# Patient Record
Sex: Female | Born: 1985
Health system: Southern US, Community
[De-identification: ages and names within clinical notes are randomized; demographics above are authoritative.]

## PROBLEM LIST (undated history)

## (undated) DIAGNOSIS — M797 Fibromyalgia: Secondary | ICD-10-CM

## (undated) DIAGNOSIS — F419 Anxiety disorder, unspecified: Secondary | ICD-10-CM

## (undated) DIAGNOSIS — T7840XA Allergy, unspecified, initial encounter: Secondary | ICD-10-CM

## (undated) DIAGNOSIS — M5430 Sciatica, unspecified side: Secondary | ICD-10-CM

## (undated) DIAGNOSIS — R519 Headache, unspecified: Secondary | ICD-10-CM

## (undated) DIAGNOSIS — N200 Calculus of kidney: Secondary | ICD-10-CM

## (undated) DIAGNOSIS — I639 Cerebral infarction, unspecified: Secondary | ICD-10-CM

## (undated) DIAGNOSIS — I10 Essential (primary) hypertension: Secondary | ICD-10-CM

## (undated) DIAGNOSIS — G9050 Complex regional pain syndrome I, unspecified: Secondary | ICD-10-CM

## (undated) DIAGNOSIS — J45909 Unspecified asthma, uncomplicated: Secondary | ICD-10-CM

## (undated) DIAGNOSIS — F32A Depression, unspecified: Secondary | ICD-10-CM

## (undated) HISTORY — DX: Headache, unspecified: R51.9

## (undated) HISTORY — DX: Anxiety disorder, unspecified: F41.9

## (undated) HISTORY — DX: Calculus of kidney: N20.0

## (undated) HISTORY — DX: Allergy, unspecified, initial encounter: T78.40XA

---

## 2016-07-15 DIAGNOSIS — G43709 Chronic migraine without aura, not intractable, without status migrainosus: Secondary | ICD-10-CM | POA: Insufficient documentation

## 2016-12-14 DIAGNOSIS — R197 Diarrhea, unspecified: Secondary | ICD-10-CM | POA: Insufficient documentation

## 2017-05-12 DIAGNOSIS — M797 Fibromyalgia: Secondary | ICD-10-CM | POA: Insufficient documentation

## 2017-07-25 DIAGNOSIS — E669 Obesity, unspecified: Secondary | ICD-10-CM | POA: Insufficient documentation

## 2017-07-25 DIAGNOSIS — E66812 Obesity, class 2: Secondary | ICD-10-CM | POA: Insufficient documentation

## 2017-08-08 DIAGNOSIS — G4700 Insomnia, unspecified: Secondary | ICD-10-CM | POA: Insufficient documentation

## 2017-08-10 DIAGNOSIS — IMO0002 Reserved for concepts with insufficient information to code with codable children: Secondary | ICD-10-CM | POA: Insufficient documentation

## 2017-10-05 DIAGNOSIS — G894 Chronic pain syndrome: Secondary | ICD-10-CM | POA: Insufficient documentation

## 2018-05-09 HISTORY — PX: DENTAL SURGERY: SHX609

## 2018-07-13 DIAGNOSIS — M5430 Sciatica, unspecified side: Secondary | ICD-10-CM | POA: Insufficient documentation

## 2018-07-13 DIAGNOSIS — F930 Separation anxiety disorder of childhood: Secondary | ICD-10-CM | POA: Insufficient documentation

## 2018-07-13 DIAGNOSIS — G9050 Complex regional pain syndrome I, unspecified: Secondary | ICD-10-CM | POA: Insufficient documentation

## 2018-07-13 DIAGNOSIS — F4312 Post-traumatic stress disorder, chronic: Secondary | ICD-10-CM | POA: Insufficient documentation

## 2018-07-13 DIAGNOSIS — F32A Depression, unspecified: Secondary | ICD-10-CM | POA: Insufficient documentation

## 2018-07-13 DIAGNOSIS — F4310 Post-traumatic stress disorder, unspecified: Secondary | ICD-10-CM | POA: Insufficient documentation

## 2019-02-27 DIAGNOSIS — T25222A Burn of second degree of left foot, initial encounter: Secondary | ICD-10-CM | POA: Insufficient documentation

## 2019-05-16 DIAGNOSIS — M79642 Pain in left hand: Secondary | ICD-10-CM | POA: Insufficient documentation

## 2019-05-16 DIAGNOSIS — M79641 Pain in right hand: Secondary | ICD-10-CM | POA: Insufficient documentation

## 2019-12-06 DIAGNOSIS — R87619 Unspecified abnormal cytological findings in specimens from cervix uteri: Secondary | ICD-10-CM | POA: Insufficient documentation

## 2019-12-06 DIAGNOSIS — Z5948 Other specified lack of adequate food: Secondary | ICD-10-CM | POA: Insufficient documentation

## 2019-12-06 DIAGNOSIS — Z599 Problem related to housing and economic circumstances, unspecified: Secondary | ICD-10-CM | POA: Insufficient documentation

## 2019-12-06 DIAGNOSIS — F17200 Nicotine dependence, unspecified, uncomplicated: Secondary | ICD-10-CM | POA: Insufficient documentation

## 2019-12-06 DIAGNOSIS — R030 Elevated blood-pressure reading, without diagnosis of hypertension: Secondary | ICD-10-CM | POA: Insufficient documentation

## 2020-08-01 ENCOUNTER — Emergency Department: Payer: Medicare HMO

## 2020-08-01 ENCOUNTER — Other Ambulatory Visit: Payer: Self-pay

## 2020-08-01 ENCOUNTER — Emergency Department
Admission: EM | Admit: 2020-08-01 | Discharge: 2020-08-01 | Disposition: A | Discharge status: Home / Self Care | Payer: Medicare HMO | Attending: Emergency Medicine | Admitting: Emergency Medicine

## 2020-08-01 DIAGNOSIS — F17200 Nicotine dependence, unspecified, uncomplicated: Secondary | ICD-10-CM | POA: Diagnosis not present

## 2020-08-01 DIAGNOSIS — M545 Low back pain, unspecified: Secondary | ICD-10-CM

## 2020-08-01 DIAGNOSIS — G8929 Other chronic pain: Secondary | ICD-10-CM | POA: Diagnosis not present

## 2020-08-01 DIAGNOSIS — R930 Abnormal findings on diagnostic imaging of skull and head, not elsewhere classified: Secondary | ICD-10-CM | POA: Insufficient documentation

## 2020-08-01 DIAGNOSIS — M549 Dorsalgia, unspecified: Secondary | ICD-10-CM | POA: Diagnosis present

## 2020-08-01 DIAGNOSIS — R519 Headache, unspecified: Secondary | ICD-10-CM | POA: Insufficient documentation

## 2020-08-01 HISTORY — DX: Complex regional pain syndrome I, unspecified: G90.50

## 2020-08-01 LAB — CBC WITH DIFFERENTIAL/PLATELET
Abs Immature Granulocytes: 0.04 10*3/uL (ref 0.00–0.07)
Basophils Absolute: 0.1 10*3/uL (ref 0.0–0.1)
Basophils Relative: 1 %
Eosinophils Absolute: 0.5 10*3/uL (ref 0.0–0.5)
Eosinophils Relative: 5 %
HCT: 36.1 % (ref 36.0–46.0)
Hemoglobin: 11.7 g/dL — ABNORMAL LOW (ref 12.0–15.0)
Immature Granulocytes: 0 %
Lymphocytes Relative: 34 %
Lymphs Abs: 3.4 10*3/uL (ref 0.7–4.0)
MCH: 28.6 pg (ref 26.0–34.0)
MCHC: 32.4 g/dL (ref 30.0–36.0)
MCV: 88.3 fL (ref 80.0–100.0)
Monocytes Absolute: 0.9 10*3/uL (ref 0.1–1.0)
Monocytes Relative: 9 %
Neutro Abs: 5.1 10*3/uL (ref 1.7–7.7)
Neutrophils Relative %: 51 %
Platelets: 240 10*3/uL (ref 150–400)
RBC: 4.09 MIL/uL (ref 3.87–5.11)
RDW: 13.4 % (ref 11.5–15.5)
WBC: 9.9 10*3/uL (ref 4.0–10.5)
nRBC: 0 % (ref 0.0–0.2)

## 2020-08-01 LAB — COMPREHENSIVE METABOLIC PANEL
ALT: 17 U/L (ref 0–44)
AST: 29 U/L (ref 15–41)
Albumin: 4 g/dL (ref 3.5–5.0)
Alkaline Phosphatase: 71 U/L (ref 38–126)
Anion gap: 7 (ref 5–15)
BUN: 14 mg/dL (ref 6–20)
CO2: 23 mmol/L (ref 22–32)
Calcium: 9.3 mg/dL (ref 8.9–10.3)
Chloride: 105 mmol/L (ref 98–111)
Creatinine, Ser: 0.72 mg/dL (ref 0.44–1.00)
GFR, Estimated: >60 mL/min (ref >60)
Glucose, Bld: 89 mg/dL (ref 70–99)
Potassium: 3.8 mmol/L (ref 3.5–5.1)
Sodium: 135 mmol/L (ref 135–145)
Total Bilirubin: 0.7 mg/dL (ref 0.3–1.2)
Total Protein: 7.3 g/dL (ref 6.5–8.1)

## 2020-08-01 LAB — CK: Total CK: 155 U/L (ref 38–234)

## 2020-08-01 MED ORDER — LORAZEPAM 2 MG/ML IJ SOLN
1.0000 mg | Freq: Once | INTRAMUSCULAR | Status: AC
  Administered 2020-08-01: 1 mg via INTRAVENOUS
  Filled 2020-08-01: qty 1

## 2020-08-01 MED ORDER — METHOCARBAMOL 750 MG PO TABS: 750.0000 mg | ORAL_TABLET | Freq: Four times a day (QID) | ORAL | 0 refills | Status: AC | PRN

## 2020-08-01 MED ORDER — MORPHINE SULFATE (PF) 4 MG/ML IV SOLN
4.0000 mg | Freq: Once | INTRAVENOUS | Status: AC
  Administered 2020-08-01: 4 mg via INTRAVENOUS
  Filled 2020-08-01: qty 1

## 2020-08-01 MED ORDER — SODIUM CHLORIDE 0.9 % IV BOLUS
1000.0000 mL | Freq: Once | INTRAVENOUS | Status: AC
  Administered 2020-08-01: 1000 mL via INTRAVENOUS

## 2020-08-01 MED ORDER — GADOBUTROL 1 MMOL/ML IV SOLN
7.5000 mL | Freq: Once | INTRAVENOUS | Status: AC | PRN
  Administered 2020-08-01: 7.5 mL via INTRAVENOUS
  Filled 2020-08-01: qty 7.5

## 2020-08-01 MED ORDER — ONDANSETRON HCL 4 MG/2ML IJ SOLN
4.0000 mg | Freq: Once | INTRAMUSCULAR | Status: AC
  Administered 2020-08-01: 4 mg via INTRAVENOUS
  Filled 2020-08-01: qty 2

## 2020-08-01 MED ORDER — MELOXICAM 15 MG PO TABS: 15.0000 mg | ORAL_TABLET | Freq: Every day | ORAL | 0 refills | Status: AC

## 2020-08-01 NOTE — ED Notes (Addendum)
Patient is alert and oriented x4 and in a lot pain. Ambulated to room and PA at bedside. Will continue to monitor and assess.

## 2020-08-01 NOTE — ED Triage Notes (Signed)
First Nurse Note:  C/O back pain (for a while) and headache today.  AAOx3.  Skin warm and dry. NAD

## 2020-08-01 NOTE — Discharge Instructions (Addendum)
Please take anti-inflammatory and muscle relaxant for headache.  You may also combine this with Tylenol, up to 1000 mg 4 times daily as needed for pain.  Please follow-up with neurology regarding your brain MRI.  Please establish with a chronic pain provider locally as able.

## 2020-08-01 NOTE — ED Triage Notes (Signed)
Pt states back pain for "a while" and a headache that started yesterday. Pt states that she gets headaches often but this one isn't going away.

## 2020-08-01 NOTE — ED Provider Notes (Signed)
  Physical Exam  BP 107/69 (BP Location: Right Arm)   Pulse 66   Temp 98 F (36.7 C) (Oral)   Resp 18   Ht 5\' 2"  (1.575 m)   Wt 81.6 kg   LMP 07/07/2020 (Exact Date)   SpO2 100%   BMI 32.92 kg/m   Physical Exam  ED Course/Procedures     Procedures  MDM  Assuming care from 09/06/2020, PA-C.  In summary, this is a patient with chronic back pain previously managed by chronic pain provider who presented to the emergency department today for evaluation of 1 day of headache.  CT showed white matter abnormality, and recommended further evaluation with MRI.  It was pending at the time of signout.  Overall, the MRI does not show any acute emergent findings.  There is some advanced white matter changes to suggest possible chronic microvascular disease versus MS.  Will have the patient follow-up with neurology regarding these findings.  Also advised the patient to establish with a local pain management provider as she previously was on multiple controlled substances including Lyrica and Percocet.  In the interim, will prescribe the patient anti-inflammatory with Mobic and muscle relaxer Robaxin.  Encouraged the use of Tylenol in addition to this until follow-up with pain medicine provider.  Discussed these findings with the patient and she is amenable with plan at this time.  Patient stable this time for outpatient follow-up.       Greig Right, PA 08/01/20 08/03/20    Nolen Mu, MD 08/03/20 [phone]

## 2020-08-01 NOTE — ED Provider Notes (Signed)
[facility] Emergency Department Provider Note  ____________________________________________   Event Date/Time   First MD Initiated Contact with Patient 08/01/20 1419     (approximate)  I have reviewed the triage vital signs and the nursing notes.   HISTORY  Chief Complaint Back Pain and Headache    HPI Helen Sparks is a 35 y.o. female presents emergency department complaining of chronic pain.  Patient has CRPS along with fibromyalgia.  She states her body has been in spasms consistently for the past few days.  She has a headache that is worse than her normal headache.  States she is feeling worse than normal.  States everything hurts to move and that her hands are contracted.  Patient is very tearful.  She denies chest pain or shortness of breath.  Symptoms have been ongoing for about 4 days.    Past Medical History:  Diagnosis Date  . Complex regional pain syndrome I     There are no problems to display for this patient.   Past Surgical History:  Procedure Laterality Date  . CESAREAN SECTION      Prior to Admission medications   Not on File    Allergies Bactrim [sulfamethoxazole-trimethoprim] and Penicillins  History reviewed. No pertinent family history.  Social History Social History   Tobacco Use  . Smoking status: Current Every Day Smoker  Substance Use Topics  . Alcohol use: Yes  . Drug use: Never    Review of Systems  Constitutional: No fever/chills Eyes: No visual changes. ENT: No sore throat. Respiratory: Denies cough Cardiovascular: Denies chest pain Gastrointestinal: Denies abdominal pain Genitourinary: Negative for dysuria. Musculoskeletal: Positive for back pain. Skin: Negative for rash. Psychiatric: no mood changes,     ____________________________________________   PHYSICAL EXAM:  VITAL SIGNS: ED Triage Vitals  Enc Vitals Group     BP 08/01/20 1341 (!) 149/100     Pulse Rate 08/01/20 1341 96      Resp 08/01/20 1341 16     Temp 08/01/20 1341 98.4 F (36.9 C)     Temp Source 08/01/20 1341 Oral     SpO2 08/01/20 1341 97 %     Weight 08/01/20 1341 180 lb (81.6 kg)     Height 08/01/20 1341 5\' 2"  (1.575 m)     Head Circumference --      Peak Flow --      Pain Score 08/01/20 1355 10     Pain Loc --      Pain Edu? --      Excl. in GC? --     Constitutional: Alert and oriented. Well appearing and in no acute distress. Eyes: Conjunctivae are normal.  Head: Atraumatic. Nose: No congestion/rhinnorhea. Mouth/Throat: Mucous membranes are moist.   Neck:  supple no lymphadenopathy noted Cardiovascular: Normal rate, regular rhythm. Heart sounds are normal Respiratory: Normal respiratory effort.  No retractions, lungs c t a  Abd: soft nontender bs normal all 4 quad GU: deferred Musculoskeletal: Patient's hands are contracted, muscles are spasmed, neurovascular is intact, neurologic:  Normal speech and language.  Skin:  Skin is warm, dry and intact. No rash noted. Psychiatric: Mood and affect are normal. Speech and behavior are normal.  ____________________________________________   LABS (all labs ordered are listed, but only abnormal results are displayed)  Labs Reviewed  CBC WITH DIFFERENTIAL/PLATELET - Abnormal; Notable for the following components:      Result Value   Hemoglobin 11.7 (*)    All other components within normal  limits  COMPREHENSIVE METABOLIC PANEL  CK   ____________________________________________   ____________________________________________  RADIOLOGY  CT of the head  ____________________________________________   PROCEDURES  Procedure(s) performed: No  Procedures    ____________________________________________   INITIAL IMPRESSION / ASSESSMENT AND PLAN / ED COURSE  Pertinent labs & imaging results that were available during my care of the patient were reviewed by me and considered in my medical decision making (see chart for details).    Patient is 35 year old female presents with chronic pain.  See HPI.  Physical exam shows patient appears stable.  Patient is very upset and crying.  States she used to have a doctor in [place] but has not had one since she moved here.  She has chronic pain issues and used to be on Lyrica and Percocet.  DDx: Chronic pain syndrome, fibromyalgia, subarachnoid, subdural, infectious etiology, malingering  Labs are reassuring, metabolic panel, CBC, and CK are all normal  CT of the head reviewed by me.  Radiology comments on abnormal white matter.  Would like for [place] to do MRI.  MRI of the brain with and without contrast.  Care transferred to [facility], PA-C     Helen Sparks was evaluated in Emergency Department on 08/01/2020 for the symptoms described in the history of present illness. She was evaluated in the context of the global COVID-19 pandemic, which necessitated consideration that the patient might be at risk for infection with the SARS-CoV-2 virus that causes COVID-19. Institutional protocols and algorithms that pertain to the evaluation of patients at risk for COVID-19 are in a state of rapid change based on information released by regulatory bodies including the CDC and federal and state organizations. These policies and algorithms were followed during the patient's care in the ED.    As part of my medical decision making, I reviewed the following data within the electronic MEDICAL RECORD NUMBER Nursing notes reviewed and incorporated, Labs reviewed , Old chart reviewed, Radiograph reviewed , Notes from prior ED visits and [place] Controlled Substance Database  ____________________________________________   FINAL CLINICAL IMPRESSION(S) / ED DIAGNOSES  Final diagnoses:  Acute exacerbation of chronic low back pain      NEW MEDICATIONS STARTED DURING THIS VISIT:  New Prescriptions   No medications on file     Note:  This document was prepared using Dragon voice recognition  software and may include unintentional dictation errors.    Faythe Ghee, PA-C 08/01/20 1919    Sharyn Creamer, MD 08/03/20 [phone]

## 2020-08-11 ENCOUNTER — Ambulatory Visit (INDEPENDENT_AMBULATORY_CARE_PROVIDER_SITE_OTHER): Payer: Medicare HMO | Admitting: Family Medicine

## 2020-08-11 ENCOUNTER — Ambulatory Visit: Payer: Self-pay | Admitting: *Deleted

## 2020-08-11 ENCOUNTER — Other Ambulatory Visit: Payer: Self-pay

## 2020-08-11 ENCOUNTER — Encounter: Payer: Self-pay | Admitting: Family Medicine

## 2020-08-11 VITALS — BP 134/72 | HR 69 | Temp 97.3°F | Ht 62.0 in | Wt 198.6 lb

## 2020-08-11 DIAGNOSIS — G894 Chronic pain syndrome: Secondary | ICD-10-CM

## 2020-08-11 DIAGNOSIS — G43809 Other migraine, not intractable, without status migrainosus: Secondary | ICD-10-CM

## 2020-08-11 DIAGNOSIS — G43909 Migraine, unspecified, not intractable, without status migrainosus: Secondary | ICD-10-CM | POA: Insufficient documentation

## 2020-08-11 DIAGNOSIS — G9050 Complex regional pain syndrome I, unspecified: Secondary | ICD-10-CM

## 2020-08-11 DIAGNOSIS — E669 Obesity, unspecified: Secondary | ICD-10-CM | POA: Diagnosis not present

## 2020-08-11 DIAGNOSIS — Z7689 Persons encountering health services in other specified circumstances: Secondary | ICD-10-CM

## 2020-08-11 DIAGNOSIS — M797 Fibromyalgia: Secondary | ICD-10-CM | POA: Diagnosis not present

## 2020-08-11 MED ORDER — DULOXETINE HCL 60 MG PO CPEP: 60.0000 mg | ORAL_CAPSULE | Freq: Every day | ORAL | 3 refills | Status: DC

## 2020-08-11 NOTE — Telephone Encounter (Signed)
I have spoken with the patient about the appt for today. She states that the appt today is only to establish care with Dr. Kirtland Bouchard. She is aware that we do not handle workmen's comp at our office. She is okay with that information and will see [place] at three today.

## 2020-08-11 NOTE — Telephone Encounter (Signed)
Patient was calling to see where her appointment was today- she is new to area. Patient has multiple concerns about previous diagnosis and she is very concerned about symptoms she has had long term. Patient has had CAT scan and MRI in the past and she thinks she may have had a stroke- at a young age- she states [place] neurology [place]. Patient is concerned about her memory- she could not remember where she had appointment today.  Patient has NP appointment at [facility] today- advised patient to keep that appointment Patient states she hit her head last night at work- she works third shift and she had bent down to put something in a bucket- she hit the top/left of her head on the machine and she states she felt like she might pass out. Patient states she is not sure if she cut her head- no obvious blood-swelling- she states the area she hit is very sore. Advised patient to keep her appointment today.    Reason for Disposition . Dangerous injury (e.g., MVA, diving, trampoline, contact sports, fall > 10 feet or 3 meters) or severe blow from hard object (e.g., golf club or baseball bat)  Answer Assessment - Initial Assessment Questions 1. MECHANISM: "How did the injury happen?" For falls, ask: "What height did you fall from?" and "What surface did you fall against?"      Hit on machine when bending over- top of head- to left 2. ONSET: "When did the injury happen?" (Minutes or hours ago)      Early am at work- 3rd shift 3. NEUROLOGIC SYMPTOMS: "Was there any loss of consciousness?" "Are there any other neurological symptoms?"      No loss of consciousness, headache 4. MENTAL STATUS: "Does the person know who he is, who you are, and where he is?"      Aware of self and alert 5. LOCATION: "What part of the head was hit?"      Top-left 6. SCALP APPEARANCE: "What does the scalp look like? Is it bleeding now?" If Yes, ask: "Is it difficult to stop?"      Not sure 7. SIZE: For cuts, bruises, or  swelling, ask: "How large is it?" (e.g., inches or centimeters)      Sore to touch 8. PAIN: "Is there any pain?" If Yes, ask: "How bad is it?"  (e.g., Scale 1-10; or mild, moderate, severe)     Yes- 9 9. TETANUS: For any breaks in the skin, ask: "When was the last tetanus booster?"     Yes- un to date 10. OTHER SYMPTOMS: "Do you have any other symptoms?" (e.g., neck pain, vomiting)       Some neck pain- always hurts 11. PREGNANCY: "Is there any chance you are pregnant?" "When was your last menstrual period?"       No- LMP- just ended  Protocols used: HEAD INJURY-A-AH

## 2020-08-11 NOTE — Patient Instructions (Addendum)
Thank you for coming to the office today.  Duloxetine 60mg  nightly take one every day to restart this medication. 30 day rx with refills.  Referral to Pain Clinic sent - stay tuned for apt we will look into options that accept your insurance and can help you further.   [facility] - Neurology Dept [address] [place], [place] Phone: [phone]   Please schedule a Follow-up Appointment to: Return in about 6 weeks (around 09/22/2020) for follow-up 6 weeks for Pain / Neurology / Headaches.  If you have any other questions or concerns, please feel free to call the office or send a message through MyChart. You may also schedule an earlier appointment if necessary.  Additionally, you may be receiving a survey about your experience at our office within a few days to 1 week by e-mail or mail. We value your feedback.  09/24/2020, DO [facility], [facility]
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
## 2020-08-17 DIAGNOSIS — G8929 Other chronic pain: Secondary | ICD-10-CM | POA: Insufficient documentation

## 2020-08-17 DIAGNOSIS — M5442 Lumbago with sciatica, left side: Secondary | ICD-10-CM | POA: Insufficient documentation

## 2020-08-17 DIAGNOSIS — M545 Low back pain, unspecified: Secondary | ICD-10-CM | POA: Diagnosis present

## 2020-08-17 DIAGNOSIS — F17210 Nicotine dependence, cigarettes, uncomplicated: Secondary | ICD-10-CM | POA: Diagnosis not present

## 2020-08-18 ENCOUNTER — Emergency Department
Admission: EM | Admit: 2020-08-18 | Discharge: 2020-08-18 | Disposition: A | Discharge status: Home / Self Care | Payer: Medicare HMO | Attending: Emergency Medicine | Admitting: Emergency Medicine

## 2020-08-18 ENCOUNTER — Other Ambulatory Visit: Payer: Self-pay

## 2020-08-18 DIAGNOSIS — M5442 Lumbago with sciatica, left side: Secondary | ICD-10-CM

## 2020-08-18 LAB — URINALYSIS, COMPLETE (UACMP) WITH MICROSCOPIC
Bacteria, UA: NONE SEEN
Bilirubin Urine: NEGATIVE
Glucose, UA: NEGATIVE mg/dL
Ketones, ur: NEGATIVE mg/dL
Leukocytes,Ua: NEGATIVE
Nitrite: NEGATIVE
Protein, ur: NEGATIVE mg/dL
Specific Gravity, Urine: 1.024 (ref 1.005–1.030)
pH: 5 (ref 5.0–8.0)

## 2020-08-18 LAB — CBC
HCT: 40.4 % (ref 36.0–46.0)
Hemoglobin: 13.4 g/dL (ref 12.0–15.0)
MCH: 29 pg (ref 26.0–34.0)
MCHC: 33.2 g/dL (ref 30.0–36.0)
MCV: 87.4 fL (ref 80.0–100.0)
Platelets: 302 10*3/uL (ref 150–400)
RBC: 4.62 MIL/uL (ref 3.87–5.11)
RDW: 13.1 % (ref 11.5–15.5)
WBC: 7.5 10*3/uL (ref 4.0–10.5)
nRBC: 0 % (ref 0.0–0.2)

## 2020-08-18 LAB — COMPREHENSIVE METABOLIC PANEL
ALT: 14 U/L (ref 0–44)
AST: 26 U/L (ref 15–41)
Albumin: 3.8 g/dL (ref 3.5–5.0)
Alkaline Phosphatase: 75 U/L (ref 38–126)
Anion gap: 6 (ref 5–15)
BUN: 9 mg/dL (ref 6–20)
CO2: 25 mmol/L (ref 22–32)
Calcium: 9.3 mg/dL (ref 8.9–10.3)
Chloride: 104 mmol/L (ref 98–111)
Creatinine, Ser: 0.79 mg/dL (ref 0.44–1.00)
GFR, Estimated: >60 mL/min (ref >60)
Glucose, Bld: 112 mg/dL — ABNORMAL HIGH (ref 70–99)
Potassium: 4.2 mmol/L (ref 3.5–5.1)
Sodium: 135 mmol/L (ref 135–145)
Total Bilirubin: 0.6 mg/dL (ref 0.3–1.2)
Total Protein: 7.6 g/dL (ref 6.5–8.1)

## 2020-08-18 MED ORDER — OXYCODONE-ACETAMINOPHEN 5-325 MG PO TABS: 1.0000 | ORAL_TABLET | ORAL | 0 refills | Status: DC | PRN

## 2020-08-18 MED ORDER — DIAZEPAM 2 MG PO TABS: 2.0000 mg | ORAL_TABLET | Freq: Three times a day (TID) | ORAL | 0 refills | Status: DC | PRN

## 2020-08-18 MED ORDER — METHYLPREDNISOLONE 4 MG PO TBPK: ORAL_TABLET | ORAL | 0 refills | Status: DC

## 2020-08-18 MED ORDER — IBUPROFEN 800 MG PO TABS: 800.0000 mg | ORAL_TABLET | Freq: Three times a day (TID) | ORAL | 0 refills | Status: DC | PRN

## 2020-08-18 MED ORDER — MORPHINE SULFATE (PF) 4 MG/ML IV SOLN
4.0000 mg | Freq: Once | INTRAVENOUS | Status: AC
Start: 2020-08-18 — End: 2020-08-18
  Administered 2020-08-18: 4 mg via INTRAMUSCULAR
  Filled 2020-08-18: qty 1

## 2020-08-18 MED ORDER — LIDOCAINE 5 % EX PTCH
1.0000 | MEDICATED_PATCH | CUTANEOUS | Status: DC
  Administered 2020-08-18: 1 via TRANSDERMAL
  Filled 2020-08-18: qty 1

## 2020-08-18 MED ORDER — DIAZEPAM 2 MG PO TABS
2.0000 mg | ORAL_TABLET | Freq: Once | ORAL | Status: AC
  Administered 2020-08-18: 2 mg via ORAL
  Filled 2020-08-18: qty 1

## 2020-08-18 MED ORDER — KETOROLAC TROMETHAMINE 60 MG/2ML IM SOLN
60.0000 mg | Freq: Once | INTRAMUSCULAR | Status: AC
  Administered 2020-08-18: 60 mg via INTRAMUSCULAR
  Filled 2020-08-18: qty 2

## 2020-08-18 MED ORDER — PREDNISONE 20 MG PO TABS
30.0000 mg | ORAL_TABLET | Freq: Once | ORAL | Status: AC
  Administered 2020-08-18: 30 mg via ORAL
  Filled 2020-08-18: qty 1

## 2020-08-18 NOTE — ED Provider Notes (Signed)
[facility] Emergency Department Provider Note   ____________________________________________   Event Date/Time   First MD Initiated Contact with Patient 08/18/20 0100     (approximate)  I have reviewed the triage vital signs and the nursing notes.   HISTORY  Chief Complaint Back pain   HPI Helen Sparks is a 35 y.o. female who presents to the ED from home with a chief complaint of acute on chronic low back pain.  Patient has a history of chronic pain syndrome, fibromyalgia, CRPS.  Recently seen by PCP and referred to pain management.  Reports awakening with low back pain with pain shooting down her left buttock into her pelvis and leg.  Denies extremity weakness, numbness or tingling.  Denies bowel or bladder incontinence.  Denies fevers, chest pain, shortness of breath, abdominal pain, nausea, vomiting or dizziness.  Denies fall/injury/trauma.     Past Medical History:  Diagnosis Date  . Allergy   . Anxiety   . Complex regional pain syndrome I   . Frequent headaches   . Kidney stone     Patient Active Problem List   Diagnosis Date Noted  . Obesity (BMI 35.0-39.9 without comorbidity) 08/11/2020  . Chronic pain syndrome 08/11/2020  . Fibromyalgia 08/11/2020  . Migraine 08/11/2020    Past Surgical History:  Procedure Laterality Date  . CESAREAN SECTION     2000, 2005  . DENTAL SURGERY  2020    Prior to Admission medications   Medication Sig Start Date End Date Taking? Authorizing Provider  diazepam (VALIUM) 2 MG tablet Take 1 tablet (2 mg total) by mouth every 8 (eight) hours as needed for muscle spasms. 08/18/20  Yes Irean Hong, MD  ibuprofen (ADVIL) 800 MG tablet Take 1 tablet (800 mg total) by mouth every 8 (eight) hours as needed for moderate pain. 08/18/20  Yes Irean Hong, MD  methylPREDNISolone (MEDROL DOSEPAK) 4 MG TBPK tablet Take as directed 08/18/20  Yes Irean Hong, MD  oxyCODONE-acetaminophen (PERCOCET/ROXICET) 5-325 MG tablet  Take 1 tablet by mouth every 4 (four) hours as needed for severe pain. 08/18/20  Yes Irean Hong, MD  DULoxetine (CYMBALTA) 60 MG capsule Take 1 capsule (60 mg total) by mouth daily. 08/11/20   Smitty Cords, DO    Allergies Bactrim [sulfamethoxazole-trimethoprim] and Penicillins  No family history on file.  Social History Social History   Tobacco Use  . Smoking status: Current Every Day Smoker    Packs/day: 1.00    Years: 20.00    Pack years: 20.00    Types: Cigarettes  . Smokeless tobacco: Never Used  Vaping Use  . Vaping Use: Never used  Substance Use Topics  . Alcohol use: Yes    Comment: 1 or 2 depending on pain  . Drug use: Never    Review of Systems  Constitutional: No fever/chills Eyes: No visual changes. ENT: No sore throat. Cardiovascular: Denies chest pain. Respiratory: Denies shortness of breath. Gastrointestinal: No abdominal pain.  No nausea, no vomiting.  No diarrhea.  No constipation. Genitourinary: Negative for dysuria. Musculoskeletal: Positive for back pain. Skin: Negative for rash. Neurological: Negative for headaches, focal weakness or numbness.   ____________________________________________   PHYSICAL EXAM:  VITAL SIGNS: ED Triage Vitals  Enc Vitals Group     BP 08/17/20 2359 121/78     Pulse Rate 08/17/20 2359 75     Resp 08/17/20 2359 18     Temp 08/17/20 2359 98.2 F (36.8 C)  Temp Source 08/17/20 2359 Oral     SpO2 08/17/20 2359 100 %     Weight 08/17/20 2358 198 lb (89.8 kg)     Height 08/17/20 2358 5\' 1"  (1.549 m)     Head Circumference --      Peak Flow --      Pain Score 08/18/20 0003 10     Pain Loc --      Pain Edu? --      Excl. in GC? --     Constitutional: Alert and oriented. Well appearing and in mild acute distress. Eyes: Conjunctivae are normal. PERRL. EOMI. Head: Atraumatic. Nose: No congestion/rhinnorhea. Mouth/Throat: Mucous membranes are moist.   Neck: No stridor.  No cervical spine tenderness  to palpation. Cardiovascular: Normal rate, regular rhythm. Grossly normal heart sounds.  Good peripheral circulation. Respiratory: Normal respiratory effort.  No retractions. Lungs CTAB. Gastrointestinal: Soft and nontender. No distention. No abdominal bruits. No CVA tenderness. Musculoskeletal: No spinal tenderness to palpation.  Left paraspinal muscle spasms.  Straight leg raise painful at 30 degrees.  No lower extremity tenderness nor edema.  No joint effusions. Neurologic:  Normal speech and language. No gross focal neurologic deficits are appreciated.  Slow gait but no gait instability. Skin:  Skin is warm, dry and intact. No rash noted. Psychiatric: Mood and affect are normal. Speech and behavior are normal.  ____________________________________________   LABS (all labs ordered are listed, but only abnormal results are displayed)  Labs Reviewed  COMPREHENSIVE METABOLIC PANEL - Abnormal; Notable for the following components:      Result Value   Glucose, Bld 112 (*)    All other components within normal limits  URINALYSIS, COMPLETE (UACMP) WITH MICROSCOPIC - Abnormal; Notable for the following components:   Color, Urine YELLOW (*)    APPearance HAZY (*)    Hgb urine dipstick MODERATE (*)    All other components within normal limits  CBC  POC URINE PREG, ED   ____________________________________________  EKG  None ____________________________________________  RADIOLOGY I, Dhanvi Boesen J, personally viewed and evaluated these images (plain radiographs) as part of my medical decision making, as well as reviewing the written report by the radiologist.  ED MD interpretation: None  Official radiology report(s): No results found.  ____________________________________________   PROCEDURES  Procedure(s) performed (including Critical Care):  Procedures   ____________________________________________   INITIAL IMPRESSION / ASSESSMENT AND PLAN / ED COURSE  As part of my  medical decision making, I reviewed the following data within the electronic MEDICAL RECORD NUMBER Nursing notes reviewed and incorporated, Labs reviewed, Old chart reviewed, Notes from prior ED visits and [place] Controlled Substance Database     35 year old female presenting with acute on chronic back pain.  Differential diagnosis includes but is not limited to sciatica, UTI, renal colic, musculoskeletal, etc.  I personally reviewed patient's records and see that her PCP recently has told her he will no longer be prescribing her pain medications and has referred her to the pain management clinic.  Patient is driving and has no one to pick her up.  Given her acute pain, will administer IM medications now and hold x4 hours per hospital policy.  Clinical Course as of 08/18/20 0651  Tue Aug 18, 2020  0215 UA negative.  Patient feeling better after IM medications.  She is on a med hold until she is cleared to drive.  Will discharge home with steroid taper, analgesia, muscle relaxer.  Patient will follow up with her PCP as needed.  Strict return precautions given.  Patient verbalizes understanding agrees with plan of care [JS]    Clinical Course User Index [JS] Irean Hong, MD     ____________________________________________   FINAL CLINICAL IMPRESSION(S) / ED DIAGNOSES  Final diagnoses:  Acute left-sided low back pain with left-sided sciatica     ED Discharge Orders         Ordered    ibuprofen (ADVIL) 800 MG tablet  Every 8 hours PRN        08/18/20 0217    oxyCODONE-acetaminophen (PERCOCET/ROXICET) 5-325 MG tablet  Every 4 hours PRN        08/18/20 0217    diazepam (VALIUM) 2 MG tablet  Every 8 hours PRN        08/18/20 0217    methylPREDNISolone (MEDROL DOSEPAK) 4 MG TBPK tablet        08/18/20 0217          *Please note:  Helen Sparks was evaluated in Emergency Department on 08/18/2020 for the symptoms described in the history of present illness. She was evaluated in the context of  the global COVID-19 pandemic, which necessitated consideration that the patient might be at risk for infection with the SARS-CoV-2 virus that causes COVID-19. Institutional protocols and algorithms that pertain to the evaluation of patients at risk for COVID-19 are in a state of rapid change based on information released by regulatory bodies including the CDC and federal and state organizations. These policies and algorithms were followed during the patient's care in the ED.  Some ED evaluations and interventions may be delayed as a result of limited staffing during and the pandemic.*   Note:  This document was prepared using Dragon voice recognition software and may include unintentional dictation errors.   Irean Hong, MD 08/18/20 [phone]

## 2020-08-18 NOTE — Discharge Instructions (Signed)
Take steroid taper as prescribed.  You may take medicines as needed for pain and muscle spasms (Motrin/Percocet/Valium #15).  Apply moist heat to affected area several times daily.

## 2020-08-18 NOTE — ED Notes (Signed)
Pt asleep in bed at this time.

## 2020-08-18 NOTE — ED Notes (Signed)
Pt given graham crackers and drink at this time
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
## 2020-08-31 ENCOUNTER — Emergency Department: Payer: Medicare HMO

## 2020-08-31 ENCOUNTER — Emergency Department
Admission: EM | Admit: 2020-08-31 | Discharge: 2020-08-31 | Disposition: A | Discharge status: Home / Self Care | Payer: Medicare HMO | Attending: Emergency Medicine | Admitting: Emergency Medicine

## 2020-08-31 ENCOUNTER — Encounter: Payer: Self-pay | Admitting: Emergency Medicine

## 2020-08-31 ENCOUNTER — Other Ambulatory Visit: Payer: Self-pay

## 2020-08-31 DIAGNOSIS — M545 Low back pain, unspecified: Secondary | ICD-10-CM | POA: Diagnosis present

## 2020-08-31 DIAGNOSIS — R197 Diarrhea, unspecified: Secondary | ICD-10-CM | POA: Diagnosis not present

## 2020-08-31 DIAGNOSIS — R102 Pelvic and perineal pain: Secondary | ICD-10-CM | POA: Diagnosis not present

## 2020-08-31 DIAGNOSIS — F17210 Nicotine dependence, cigarettes, uncomplicated: Secondary | ICD-10-CM | POA: Insufficient documentation

## 2020-08-31 DIAGNOSIS — R1084 Generalized abdominal pain: Secondary | ICD-10-CM

## 2020-08-31 DIAGNOSIS — G8929 Other chronic pain: Secondary | ICD-10-CM | POA: Diagnosis not present

## 2020-08-31 LAB — CBC WITH DIFFERENTIAL/PLATELET
Abs Immature Granulocytes: 0.03 10*3/uL (ref 0.00–0.07)
Basophils Absolute: 0 10*3/uL (ref 0.0–0.1)
Basophils Relative: 0 %
Eosinophils Absolute: 0.2 10*3/uL (ref 0.0–0.5)
Eosinophils Relative: 2 %
HCT: 39.8 % (ref 36.0–46.0)
Hemoglobin: 13.3 g/dL (ref 12.0–15.0)
Immature Granulocytes: 0 %
Lymphocytes Relative: 29 %
Lymphs Abs: 2.8 10*3/uL (ref 0.7–4.0)
MCH: 29.3 pg (ref 26.0–34.0)
MCHC: 33.4 g/dL (ref 30.0–36.0)
MCV: 87.7 fL (ref 80.0–100.0)
Monocytes Absolute: 0.9 10*3/uL (ref 0.1–1.0)
Monocytes Relative: 9 %
Neutro Abs: 5.9 10*3/uL (ref 1.7–7.7)
Neutrophils Relative %: 60 %
Platelets: 250 10*3/uL (ref 150–400)
RBC: 4.54 MIL/uL (ref 3.87–5.11)
RDW: 13.7 % (ref 11.5–15.5)
WBC: 9.8 10*3/uL (ref 4.0–10.5)
nRBC: 0 % (ref 0.0–0.2)

## 2020-08-31 LAB — URINALYSIS, COMPLETE (UACMP) WITH MICROSCOPIC
Bacteria, UA: NONE SEEN
Bilirubin Urine: NEGATIVE
Glucose, UA: NEGATIVE mg/dL
Ketones, ur: NEGATIVE mg/dL
Leukocytes,Ua: NEGATIVE
Nitrite: NEGATIVE
Protein, ur: NEGATIVE mg/dL
Specific Gravity, Urine: 1.011 (ref 1.005–1.030)
pH: 6 (ref 5.0–8.0)

## 2020-08-31 LAB — COMPREHENSIVE METABOLIC PANEL
ALT: 13 U/L (ref 0–44)
AST: 25 U/L (ref 15–41)
Albumin: 4.1 g/dL (ref 3.5–5.0)
Alkaline Phosphatase: 60 U/L (ref 38–126)
Anion gap: 8 (ref 5–15)
BUN: 10 mg/dL (ref 6–20)
CO2: 23 mmol/L (ref 22–32)
Calcium: 9.1 mg/dL (ref 8.9–10.3)
Chloride: 103 mmol/L (ref 98–111)
Creatinine, Ser: 0.73 mg/dL (ref 0.44–1.00)
GFR, Estimated: >60 mL/min (ref >60)
Glucose, Bld: 88 mg/dL (ref 70–99)
Potassium: 3.9 mmol/L (ref 3.5–5.1)
Sodium: 134 mmol/L — ABNORMAL LOW (ref 135–145)
Total Bilirubin: 0.5 mg/dL (ref 0.3–1.2)
Total Protein: 7.6 g/dL (ref 6.5–8.1)

## 2020-08-31 LAB — PREGNANCY, URINE: Preg Test, Ur: NEGATIVE

## 2020-08-31 MED ORDER — OXYCODONE-ACETAMINOPHEN 5-325 MG PO TABS: 1.0000 | ORAL_TABLET | ORAL | 0 refills | Status: DC | PRN

## 2020-08-31 MED ORDER — MORPHINE SULFATE (PF) 4 MG/ML IV SOLN
4.0000 mg | Freq: Once | INTRAVENOUS | Status: AC
Start: 2020-08-31 — End: 2020-08-31
  Administered 2020-08-31: 4 mg via INTRAVENOUS
  Filled 2020-08-31: qty 1

## 2020-08-31 MED ORDER — IBUPROFEN 800 MG PO TABS: 800.0000 mg | ORAL_TABLET | Freq: Three times a day (TID) | ORAL | 0 refills | Status: DC | PRN

## 2020-08-31 MED ORDER — SODIUM CHLORIDE 0.9 % IV BOLUS
1000.0000 mL | Freq: Once | INTRAVENOUS | Status: AC
  Administered 2020-08-31: 1000 mL via INTRAVENOUS

## 2020-08-31 MED ORDER — IOHEXOL 300 MG/ML  SOLN
100.0000 mL | Freq: Once | INTRAMUSCULAR | Status: AC | PRN
  Administered 2020-08-31: 100 mL via INTRAVENOUS
  Filled 2020-08-31: qty 100

## 2020-08-31 MED ORDER — ONDANSETRON HCL 4 MG/2ML IJ SOLN
4.0000 mg | Freq: Once | INTRAMUSCULAR | Status: AC
  Administered 2020-08-31: 4 mg via INTRAVENOUS
  Filled 2020-08-31: qty 2

## 2020-08-31 MED ORDER — FENTANYL CITRATE (PF) 100 MCG/2ML IJ SOLN
50.0000 ug | Freq: Once | INTRAMUSCULAR | Status: AC
  Administered 2020-08-31: 50 ug via INTRAVENOUS
  Filled 2020-08-31: qty 2

## 2020-08-31 MED ORDER — DIAZEPAM 2 MG PO TABS: 2.0000 mg | ORAL_TABLET | Freq: Three times a day (TID) | ORAL | 0 refills | Status: DC | PRN
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
## 2020-08-31 NOTE — Discharge Instructions (Addendum)
Follow-up with your regular doctor. Follow-up with the pain clinic Return if worsening

## 2020-08-31 NOTE — ED Notes (Signed)
Patient was going to drive home and said she just lived close.  I explained that she was given narcotics and cannot drive home.  I have asked her to call for a ride.

## 2020-08-31 NOTE — ED Provider Notes (Signed)
[facility] Emergency Department Provider Note  ____________________________________________   Event Date/Time   First MD Initiated Contact with Patient 08/31/20 0915     (approximate)  I have reviewed the triage vital signs and the nursing notes.   HISTORY  Chief Complaint Back Pain and Pelvic Pain    HPI Mayerly Kaman is a 35 y.o. female presents emergency department complaining of low back pain and pelvic pain.  States she feels like her uterus is going to drop out.  States she has chronic pain issues.  Has history of herniated disc at the posterior.  States started having a a lot of diarrhea today.  She denies fever or chills.  No chest pain or shortness of breath    Past Medical History:  Diagnosis Date  . Allergy   . Anxiety   . Complex regional pain syndrome I   . Frequent headaches   . Kidney stone     Patient Active Problem List   Diagnosis Date Noted  . Obesity (BMI 35.0-39.9 without comorbidity) 08/11/2020  . Chronic pain syndrome 08/11/2020  . Fibromyalgia 08/11/2020  . Migraine 08/11/2020    Past Surgical History:  Procedure Laterality Date  . CESAREAN SECTION     2000, 2005  . DENTAL SURGERY  2020    Prior to Admission medications   Medication Sig Start Date End Date Taking? Authorizing Provider  diazepam (VALIUM) 2 MG tablet Take 1 tablet (2 mg total) by mouth every 8 (eight) hours as needed for anxiety. 08/31/20 08/31/21 Yes Leeann Bady, Roselyn Bering, PA-C  ibuprofen (ADVIL) 800 MG tablet Take 1 tablet (800 mg total) by mouth every 8 (eight) hours as needed. 08/31/20  Yes Zuleika Gallus, Roselyn Bering, PA-C  oxyCODONE-acetaminophen (PERCOCET) 5-325 MG tablet Take 1 tablet by mouth every 4 (four) hours as needed for severe pain. 08/31/20 08/31/21 Yes Ginni Eichler, Roselyn Bering, PA-C  DULoxetine (CYMBALTA) 60 MG capsule Take 1 capsule (60 mg total) by mouth daily. 08/11/20   Smitty Cords, DO    Allergies Bactrim [sulfamethoxazole-trimethoprim] and  Penicillins  No family history on file.  Social History Social History   Tobacco Use  . Smoking status: Current Every Day Smoker    Packs/day: 1.00    Years: 20.00    Pack years: 20.00    Types: Cigarettes  . Smokeless tobacco: Never Used  Vaping Use  . Vaping Use: Never used  Substance Use Topics  . Alcohol use: Yes    Comment: 1 or 2 depending on pain  . Drug use: Never    Review of Systems  Constitutional: No fever/chills Eyes: No visual changes. ENT: No sore throat. Respiratory: Denies cough Cardiovascular: Denies chest pain Gastrointestinal: Positive abdominal pain Genitourinary: Negative for dysuria. Musculoskeletal: Negative for back pain. Skin: Negative for rash. Psychiatric: no mood changes,     ____________________________________________   PHYSICAL EXAM:  VITAL SIGNS: ED Triage Vitals  Enc Vitals Group     BP 08/31/20 0915 (!) 132/100     Pulse Rate 08/31/20 0915 75     Resp 08/31/20 0915 20     Temp 08/31/20 0915 98.7 F (37.1 C)     Temp Source 08/31/20 0915 Oral     SpO2 08/31/20 0915 100 %     Weight 08/31/20 0916 194 lb (88 kg)     Height 08/31/20 0916 5\' 1"  (1.549 m)     Head Circumference --      Peak Flow --      Pain  Score 08/31/20 0916 10     Pain Loc --      Pain Edu? --      Excl. in GC? --     Constitutional: Alert and oriented. Well appearing and in no acute distress. Eyes: Conjunctivae are normal.  Head: Atraumatic. Nose: No congestion/rhinnorhea. Mouth/Throat: Mucous membranes are moist.   Neck:  supple no lymphadenopathy noted Cardiovascular: Normal rate, regular rhythm. Heart sounds are normal Respiratory: Normal respiratory effort.  No retractions, lungs c t a  Abd: soft tender in all 4 quads, increased in lower quads bilaterally bs normal all 4 quad GU: deferred Musculoskeletal: FROM all extremities, warm and well perfused Neurologic:  Normal speech and language.  Skin:  Skin is warm, dry and intact. No rash  noted. Psychiatric: Mood and affect are normal. Speech and behavior are normal.  ____________________________________________   LABS (all labs ordered are listed, but only abnormal results are displayed)  Labs Reviewed  COMPREHENSIVE METABOLIC PANEL - Abnormal; Notable for the following components:      Result Value   Sodium 134 (*)    All other components within normal limits  URINALYSIS, COMPLETE (UACMP) WITH MICROSCOPIC - Abnormal; Notable for the following components:   Color, Urine YELLOW (*)    APPearance CLEAR (*)    Hgb urine dipstick SMALL (*)    All other components within normal limits  CBC WITH DIFFERENTIAL/PLATELET  PREGNANCY, URINE   ____________________________________________   ____________________________________________  RADIOLOGY  CT abdomen/pelvis IV contrast  ____________________________________________   PROCEDURES  Procedure(s) performed: No  Procedures    ____________________________________________   INITIAL IMPRESSION / ASSESSMENT AND PLAN / ED COURSE  Pertinent labs & imaging results that were available during my care of the patient were reviewed by me and considered in my medical decision making (see chart for details).   Patient is a 35 year old female presents with abdominal pain and history of chronic pain.  See HPI.  Physical exam shows patient appears stable although uncomfortable.  BP is elevated, questional due to pain and anxiety  DDx: Acute exasperation of chronic pain, acute appendicitis, acute diverticulitis, gastroenteritis, pelvic pain  CBC, metabolic panel, and urine pregnancy, UA Saline lock, normal saline 1 L, morphine 4 mg, Zofran 4 mg IV  Once IVs established will order CT abdomen/pelvis with IV contrast   Patient was given morphine and then fentanyl for pain.  Labs are reassuring.  Appear to be normal.  CBC, metabolic panel, UA are all normal, POC pregnancy was negative.  CT of the abdomen/pelvis reviewed by  me confirmed by radiology to be negative for any acute abnormalities  I did discuss findings with patient.  She is requesting muscle relaxers and pain medication for her back.  I did explain to her that she needs to follow-up with the pain clinic.  We will give her just a few pain medications.  I do see that she has several scars and a history of chronic pain.  She is to follow-up with her regular doctor if not improving in 2 to 3 days.  Return if worsening.  Did caution her that we cannot continue to give her narcotic medication now emergency department.  She was discharged in stable condition.  Shainna Faux was evaluated in Emergency Department on 08/31/2020 for the symptoms described in the history of present illness. She was evaluated in the context of the global COVID-19 pandemic, which necessitated consideration that the patient might be at risk for infection with the SARS-CoV-2 virus that causes  COVID-19. Institutional protocols and algorithms that pertain to the evaluation of patients at risk for COVID-19 are in a state of rapid change based on information released by regulatory bodies including the CDC and federal and state organizations. These policies and algorithms were followed during the patient's care in the ED.    As part of my medical decision making, I reviewed the following data within the electronic MEDICAL RECORD NUMBER Nursing notes reviewed and incorporated, Labs reviewed , Old chart reviewed, Radiograph reviewed , Notes from prior ED visits and [place] Controlled Substance Database  ____________________________________________   FINAL CLINICAL IMPRESSION(S) / ED DIAGNOSES  Final diagnoses:  Acute exacerbation of chronic low back pain  Generalized abdominal pain      NEW MEDICATIONS STARTED DURING THIS VISIT:  New Prescriptions   DIAZEPAM (VALIUM) 2 MG TABLET    Take 1 tablet (2 mg total) by mouth every 8 (eight) hours as needed for anxiety.   IBUPROFEN (ADVIL) 800 MG TABLET     Take 1 tablet (800 mg total) by mouth every 8 (eight) hours as needed.   OXYCODONE-ACETAMINOPHEN (PERCOCET) 5-325 MG TABLET    Take 1 tablet by mouth every 4 (four) hours as needed for severe pain.     Note:  This document was prepared using Dragon voice recognition software and may include unintentional dictation errors.    Faythe Ghee, PA-C 08/31/20 1409    Merwyn Katos, MD 08/31/20 1540

## 2020-08-31 NOTE — ED Triage Notes (Signed)
Presents with lower back and pelvic pain  States she has hx of both   Was dx'd with herniated disc several years ago  Was seen for same about 2 weeks ago  Placed on meds and dx'd with sciatica

## 2020-09-07 ENCOUNTER — Other Ambulatory Visit: Payer: Self-pay | Admitting: Physician Assistant

## 2020-09-07 DIAGNOSIS — R9389 Abnormal findings on diagnostic imaging of other specified body structures: Secondary | ICD-10-CM

## 2020-09-07 DIAGNOSIS — G379 Demyelinating disease of central nervous system, unspecified: Secondary | ICD-10-CM

## 2020-09-11 ENCOUNTER — Other Ambulatory Visit: Payer: Self-pay | Admitting: Family Medicine

## 2020-09-11 DIAGNOSIS — G894 Chronic pain syndrome: Secondary | ICD-10-CM

## 2020-09-11 MED ORDER — IBUPROFEN 800 MG PO TABS: 800.0000 mg | ORAL_TABLET | Freq: Three times a day (TID) | ORAL | 0 refills | Status: DC | PRN

## 2020-09-11 NOTE — Telephone Encounter (Signed)
Requested medication (s) are due for refill today: yes  Requested medication (s) are on the active medication list: yes  Last refill:  08/31/2020  Future visit scheduled: yes  Notes to clinic:  this refill cannot be delegated    Requested Prescriptions  Pending Prescriptions Disp Refills   oxyCODONE-acetaminophen (PERCOCET) 5-325 MG tablet 8 tablet 0    Sig: Take 1 tablet by mouth every 4 (four) hours as needed for severe pain.      There is no refill protocol information for this order      ibuprofen (ADVIL) 800 MG tablet 30 tablet 0    Sig: Take 1 tablet (800 mg total) by mouth every 8 (eight) hours as needed.      There is no refill protocol information for this order      diazepam (VALIUM) 2 MG tablet 15 tablet 0    Sig: Take 1 tablet (2 mg total) by mouth every 8 (eight) hours as needed for anxiety.      There is no refill protocol information for this order

## 2020-09-11 NOTE — Telephone Encounter (Signed)
Copied from CRM [id number]. Topic: Quick Communication - Rx Refill/Question >> Sep 11, 2020  9:59 AM Mcneil, Ja-Kwan wrote: Medication: diazepam (VALIUM) 2 MG tablet, ibuprofen (ADVIL) 800 MG tablet, and oxyCODONE-acetaminophen (PERCOCET) 5-325 MG tablet  Has the patient contacted their pharmacy? no  Preferred Pharmacy (with phone number or street name): [id number] [place], [place] - [address] AT [facility]   Phone: [phone]   Fax: [phone]  Agent: Please be advised that RX refills may take up to 3 business days. We ask that you follow-up with your pharmacy.

## 2020-09-16 ENCOUNTER — Emergency Department
Admission: EM | Admit: 2020-09-16 | Discharge: 2020-09-17 | Disposition: A | Discharge status: Home / Self Care | Payer: Medicare HMO | Attending: Emergency Medicine | Admitting: Emergency Medicine

## 2020-09-16 ENCOUNTER — Encounter: Payer: Self-pay | Admitting: Emergency Medicine

## 2020-09-16 ENCOUNTER — Telehealth: Payer: Self-pay | Admitting: Family Medicine

## 2020-09-16 ENCOUNTER — Other Ambulatory Visit: Payer: Self-pay

## 2020-09-16 DIAGNOSIS — G43009 Migraine without aura, not intractable, without status migrainosus: Secondary | ICD-10-CM | POA: Diagnosis not present

## 2020-09-16 DIAGNOSIS — G8929 Other chronic pain: Secondary | ICD-10-CM

## 2020-09-16 DIAGNOSIS — M5442 Lumbago with sciatica, left side: Secondary | ICD-10-CM | POA: Diagnosis not present

## 2020-09-16 DIAGNOSIS — F17210 Nicotine dependence, cigarettes, uncomplicated: Secondary | ICD-10-CM | POA: Diagnosis not present

## 2020-09-16 DIAGNOSIS — R519 Headache, unspecified: Secondary | ICD-10-CM | POA: Diagnosis present

## 2020-09-16 HISTORY — DX: Fibromyalgia: M79.7

## 2020-09-16 MED ORDER — PROCHLORPERAZINE EDISYLATE 10 MG/2ML IJ SOLN
10.0000 mg | Freq: Once | INTRAMUSCULAR | Status: AC
  Administered 2020-09-16: 10 mg via INTRAVENOUS
  Filled 2020-09-16: qty 2

## 2020-09-16 MED ORDER — KETOROLAC TROMETHAMINE 30 MG/ML IJ SOLN
15.0000 mg | Freq: Once | INTRAMUSCULAR | Status: AC
  Administered 2020-09-16: 15 mg via INTRAVENOUS
  Filled 2020-09-16: qty 1

## 2020-09-16 MED ORDER — DIPHENHYDRAMINE HCL 50 MG/ML IJ SOLN
25.0000 mg | Freq: Once | INTRAMUSCULAR | Status: AC
  Administered 2020-09-16: 25 mg via INTRAVENOUS
  Filled 2020-09-16: qty 1

## 2020-09-16 MED ORDER — LIDOCAINE 5 % EX PTCH: 1.0000 | MEDICATED_PATCH | Freq: Two times a day (BID) | CUTANEOUS | 0 refills | Status: DC

## 2020-09-16 MED ORDER — NORTRIPTYLINE HCL 10 MG PO CAPS: 10.0000 mg | ORAL_CAPSULE | Freq: Every day | ORAL | 2 refills | Status: DC

## 2020-09-16 MED ORDER — LIDOCAINE 5 % EX PTCH
1.0000 | MEDICATED_PATCH | CUTANEOUS | Status: DC
  Administered 2020-09-16: 1 via TRANSDERMAL
  Filled 2020-09-16: qty 1

## 2020-09-16 NOTE — ED Provider Notes (Signed)
[facility] Emergency Department Provider Note   ____________________________________________   Event Date/Time   First MD Initiated Contact with Patient 09/16/20 2051     (approximate)  I have reviewed the triage vital signs and the nursing notes.   HISTORY  Chief Complaint Headache and Hip Pain    HPI Helen Sparks is a 35 y.o. female with past medical history of migraines, fibromyalgia, and complex regional pain syndrome who presents to the ED complaining of headache and back pain.  Patient reports that she deals with chronic pain to the left lower side of her back that radiates down her left leg.  She has been previously diagnosed with sciatica and states current symptoms are similar but have seemed to flareup over the past couple of days.  She additionally reports a diffuse throbbing headache, similar to prior migraines.  She denies any associated vision changes, speech changes, numbness, or weakness.  She has not had any difficulty going to the bathroom and denies any saddle anesthesia.  She has been following with neurology for her headaches and was recently prescribed Pamelor, but states this was sent to the wrong pharmacy and she has been unable to pick it up.  She additionally has been provided with referral to pain management, states she schedule an appointment for late June.  She additionally recently had abnormal MRI and is scheduled for lumbar puncture in 2 days.  She states she does not currently have any medication available at home to manage her pain.        Past Medical History:  Diagnosis Date  . Allergy   . Anxiety   . Complex regional pain syndrome I   . Fibromyalgia   . Frequent headaches   . Kidney stone     Patient Active Problem List   Diagnosis Date Noted  . Obesity (BMI 35.0-39.9 without comorbidity) 08/11/2020  . Chronic pain syndrome 08/11/2020  . Fibromyalgia 08/11/2020  . Migraine 08/11/2020    Past Surgical History:   Procedure Laterality Date  . CESAREAN SECTION     2000, 2005  . DENTAL SURGERY  2020    Prior to Admission medications   Medication Sig Start Date End Date Taking? Authorizing Provider  diazepam (VALIUM) 2 MG tablet Take 1 tablet (2 mg total) by mouth every 8 (eight) hours as needed for anxiety. 08/31/20 08/31/21 Yes Fisher, Roselyn Bering, PA-C  DULoxetine (CYMBALTA) 60 MG capsule Take 1 capsule (60 mg total) by mouth daily. 08/11/20  Yes Karamalegos, Netta Neat, DO  ibuprofen (ADVIL) 800 MG tablet Take 1 tablet (800 mg total) by mouth every 8 (eight) hours as needed. 09/11/20  Yes Karamalegos, Alexander J, DO  lidocaine (LIDODERM) 5 % Place 1 patch onto the skin every 12 (twelve) hours. Remove & Discard patch within 12 hours or as directed by MD 09/16/20 09/16/21 Yes Chesley Noon, MD  nortriptyline (PAMELOR) 10 MG capsule Take 1 capsule (10 mg total) by mouth at bedtime. Increase to 20 mg nightly after 1 week 09/16/20 09/16/21 Yes Chesley Noon, MD  oxyCODONE-acetaminophen (PERCOCET) 5-325 MG tablet Take 1 tablet by mouth every 4 (four) hours as needed for severe pain. 08/31/20 08/31/21 Yes Fisher, Roselyn Bering, PA-C    Allergies Bactrim [sulfamethoxazole-trimethoprim] and Penicillins  History reviewed. No pertinent family history.  Social History Social History   Tobacco Use  . Smoking status: Current Every Day Smoker    Packs/day: 1.00    Years: 20.00    Pack years: 20.00  Types: Cigarettes  . Smokeless tobacco: Never Used  Vaping Use  . Vaping Use: Never used  Substance Use Topics  . Alcohol use: Yes    Comment: 1 or 2 depending on pain  . Drug use: Never    Review of Systems  Constitutional: No fever/chills Eyes: No visual changes. ENT: No sore throat. Cardiovascular: Denies chest pain. Respiratory: Denies shortness of breath. Gastrointestinal: No abdominal pain.  No nausea, no vomiting.  No diarrhea.  No constipation. Genitourinary: Negative for dysuria. Musculoskeletal:  Positive for back pain. Skin: Negative for rash. Neurological: Positive for headaches, negative for focal weakness or numbness.  ____________________________________________   PHYSICAL EXAM:  VITAL SIGNS: ED Triage Vitals  Enc Vitals Group     BP 09/16/20 2037 (!) 155/90     Pulse Rate 09/16/20 2037 69     Resp 09/16/20 2037 20     Temp 09/16/20 2037 98.1 F (36.7 C)     Temp Source 09/16/20 2037 Oral     SpO2 09/16/20 2037 100 %     Weight 09/16/20 2037 185 lb (83.9 kg)     Height 09/16/20 2037 5\' 1"  (1.549 m)     Head Circumference --      Peak Flow --      Pain Score 09/16/20 2042 10     Pain Loc --      Pain Edu? --      Excl. in GC? --     Constitutional: Alert and oriented. Eyes: Conjunctivae are normal.  Pupils equal round and reactive to light bilaterally. Head: Atraumatic. Nose: No congestion/rhinnorhea. Mouth/Throat: Mucous membranes are moist. Neck: Normal ROM Cardiovascular: Normal rate, regular rhythm. Grossly normal heart sounds. Respiratory: Normal respiratory effort.  No retractions. Lungs CTAB. Gastrointestinal: Soft and nontender. No distention. Genitourinary: deferred Musculoskeletal: No lower extremity tenderness nor edema.  Midline and left lateral lumbar spinal tenderness to palpation with no step-offs or deformities. Neurologic:  Normal speech and language. No gross focal neurologic deficits are appreciated. Skin:  Skin is warm, dry and intact. No rash noted. Psychiatric: Mood and affect are normal. Speech and behavior are normal.  ____________________________________________   LABS (all labs ordered are listed, but only abnormal results are displayed)  Labs Reviewed - No data to display   PROCEDURES  Procedure(s) performed (including Critical Care):  Procedures   ____________________________________________   INITIAL IMPRESSION / ASSESSMENT AND PLAN / ED COURSE       35 year old female with past medical history of migraines,  fibromyalgia, and complex regional pain syndrome who presents to the ED with acute on chronic left lower back pain radiating down her left leg along with recurrent headache.  Symptoms seem consistent with chronic back pain and sciatica, patient is neurovascular intact to her bilateral lower extremities.  No findings to suggest cauda equina.  She has had recent imaging of her abdomen and pelvis and I do not feel repeat imaging or labs are warranted today, symptoms seem chronic and unchanged.  No findings to suggest SAH or meningitis.  We will treat with migraine cocktail as well as dose of Toradol and Lidoderm patch for her back pain.  Patient reports improvement in symptoms following migraine cocktail along with Toradol and Lidoderm patch.  She states she has been unable to pick up the Pamelor because it was sent to the wrong pharmacy.  We will send new prescription to her pharmacy locally and she was advised to follow-up with her PCP as well as pain management.  She was counseled to return to the ED for new worsening symptoms, patient agrees with plan.      ____________________________________________   FINAL CLINICAL IMPRESSION(S) / ED DIAGNOSES  Final diagnoses:  Migraine without aura and without status migrainosus, not intractable  Chronic left-sided low back pain with left-sided sciatica     ED Discharge Orders         Ordered    lidocaine (LIDODERM) 5 %  Every 12 hours        09/16/20 2244    nortriptyline (PAMELOR) 10 MG capsule  Daily at bedtime        09/16/20 2244           Note:  This document was prepared using Dragon voice recognition software and may include unintentional dictation errors.   Chesley Noon, MD 09/16/20 2248

## 2020-09-16 NOTE — Progress Notes (Incomplete)
Left message for pt. To call IR office re: LP scheduled for 09/18/20. No answer from pt. At present. Left instructions for pt. To be NPO after MN, hold Cymbalta for 48 hrs., have a driver to take her home,and expect to be at hospital 4 hrs. After LP.

## 2020-09-16 NOTE — ED Notes (Signed)
EDP, [place] to bedside.

## 2020-09-16 NOTE — ED Triage Notes (Signed)
Pt to ED from home c/o left headache x2 days and left hip pain.  States hx of sciatica, fibromyalgia.  Has had brain scans done told potential MS, mini stroke or early signs of dementia.  Sees neurology at [facility].  Pt A&Ox4, chest rise even and unlabored.

## 2020-09-16 NOTE — Telephone Encounter (Signed)
Left message for patient to call back and schedule Medicare Annual Wellness Visit (AWV) to be done virtually or by telephone.  No hx of AWV eligible as of 01/01/2011awvi   Please schedule at anytime with [facility] Health Advisor.      45 Minutes appointment   Any questions, please call me at [phone]

## 2020-09-16 NOTE — ED Notes (Signed)
Pt ambulatory to toilet independently, difficulty noted due to pain.

## 2020-09-17 ENCOUNTER — Ambulatory Visit: Payer: Medicare HMO | Admitting: Family Medicine

## 2020-09-18 ENCOUNTER — Ambulatory Visit
Admission: RE | Admit: 2020-09-18 | Discharge: 2020-09-18 | Disposition: A | Discharge status: Home / Self Care | Payer: Medicare HMO | Source: Ambulatory Visit | Attending: Physician Assistant | Admitting: Physician Assistant

## 2020-09-18 ENCOUNTER — Other Ambulatory Visit: Payer: Self-pay

## 2020-09-18 DIAGNOSIS — R9389 Abnormal findings on diagnostic imaging of other specified body structures: Secondary | ICD-10-CM | POA: Insufficient documentation

## 2020-09-18 DIAGNOSIS — G379 Demyelinating disease of central nervous system, unspecified: Secondary | ICD-10-CM | POA: Diagnosis not present

## 2020-09-18 HISTORY — DX: Sciatica, unspecified side: M54.30

## 2020-09-18 HISTORY — DX: Depression, unspecified: F32.A

## 2020-09-18 HISTORY — DX: Cerebral infarction, unspecified: I63.9

## 2020-09-18 HISTORY — DX: Unspecified asthma, uncomplicated: J45.909

## 2020-09-18 HISTORY — DX: Essential (primary) hypertension: I10

## 2020-09-18 LAB — CSF CELL COUNT WITH DIFFERENTIAL
Eosinophils, CSF: 0 %
Lymphs, CSF: 86 %
Monocyte-Macrophage-Spinal Fluid: 14 %
RBC Count, CSF: 17 /mm3 — ABNORMAL HIGH (ref 0–3)
Segmented Neutrophils-CSF: 0 %
Tube #: 3
WBC, CSF: 3 /mm3 (ref 0–5)

## 2020-09-18 LAB — PROTEIN, CSF: Total  Protein, CSF: 30 mg/dL (ref 15–45)

## 2020-09-18 LAB — PREGNANCY, URINE: Preg Test, Ur: NEGATIVE

## 2020-09-18 LAB — GLUCOSE, CSF: Glucose, CSF: 53 mg/dL (ref 40–70)

## 2020-09-18 MED ORDER — ACETAMINOPHEN 500 MG PO TABS
ORAL_TABLET | ORAL | Status: AC
  Filled 2020-09-18: qty 2

## 2020-09-18 MED ORDER — ACETAMINOPHEN 500 MG PO TABS
1000.0000 mg | ORAL_TABLET | Freq: Once | ORAL | Status: AC
  Administered 2020-09-18: 1000 mg via ORAL

## 2020-09-18 MED ORDER — ACETAMINOPHEN 500 MG PO TABS
1000.0000 mg | ORAL_TABLET | Freq: Four times a day (QID) | ORAL | Status: DC | PRN
  Filled 2020-09-18: qty 2

## 2020-09-18 NOTE — Progress Notes (Signed)
Dropped off patient in bay 4. Orlinda Blalock, RN, came in room with patient.

## 2020-09-18 NOTE — Progress Notes (Signed)
Patient clinically stable post LP per Dr Allena Katz, vitals stable. Stating pain down to 6/10,"much better" discharge instructions post LP given. Permission to discharge at this time per Dr Allena Katz.

## 2020-09-18 NOTE — Discharge Instructions (Signed)
Lumbar Puncture, Care After This sheet gives you information about how to care for yourself after your procedure. Your health care provider may also give you more specific instructions. If you have problems or questions, contact your health care provider. What can I expect after the procedure? After the procedure, it is common to have:  Mild discomfort or pain at the puncture site.  A mild headache that is relieved with pain medicines. Follow these instructions at home: Activity  Lie down flat or rest for as long as directed by your health care provider.  Return to your normal activities as told by your health care provider. Ask your health care provider what activities are safe for you.  Avoid lifting anything heavier than 10 lb (4.5 kg) for at least 12 hours after the procedure.  Do not drive for 24 hours if you were given a medicine to help you relax (sedative) during your procedure.  Do not drive or use heavy machinery while taking prescription pain medicine.   Puncture site care  Remove or change your bandage (dressing) as told by your health care provider.  Check your puncture area every day for signs of infection. Check for: ? More pain. ? Redness or swelling. ? Fluid or blood leaking from the puncture site. ? Warmth. ? Pus or a bad smell. General instructions  Take over-the-counter and prescription medicines only as told by your health care provider.  Drink enough fluids to keep your urine clear or pale yellow. Your health care provider may recommend drinking caffeine to prevent a headache.  Keep all follow-up visits as told by your health care provider. This is important. Contact a health care provider if:  You have fever or chills.  You have nausea or vomiting.  You have a headache that lasts for more than 2 days or does not get better with medicine. Get help right away if:  You develop any of the following in your  legs: ? Weakness. ? Numbness. ? Tingling.  You are unable to control when you urinate or have a bowel movement (incontinence).  You have signs of infection around your puncture site, such as: ? More pain. ? Redness or swelling. ? Fluid or blood leakage. ? Warmth. ? Pus or a bad smell.  You are dizzy or you feel like you might faint.  You have a severe headache, especially when you sit or stand. Summary  A lumbar puncture is a procedure in which a small needle is inserted into the lower back to remove fluid that surrounds the brain and spinal cord.  After this procedure, it is common to have a headache and pain around the needle insertion area.  Lying flat, staying hydrated, and drinking caffeine can help prevent headaches.  Monitor your needle insertion site for signs of infection, including warmth, fluid, or more pain.  Get help right away if you develop leg weakness, leg numbness, incontinence, or severe headaches. This information is not intended to replace advice given to you by your health care provider. Make sure you discuss any questions you have with your health care provider. Document Revised: 03/05/2020 Document Reviewed: 03/05/2020 Elsevier Patient Education  2021 Elsevier Inc.  

## 2020-09-21 ENCOUNTER — Telehealth: Payer: Self-pay | Admitting: Family Medicine

## 2020-09-21 NOTE — Telephone Encounter (Signed)
Pt is calling to receive the results of her DG LUMBAR PUNCTURE  Pt is reqeusting a week worth of perocecet and muscle relaxer.  Please advise

## 2020-09-21 NOTE — Telephone Encounter (Signed)
Spoke to pt let her know that she would have to call [facility] Neurologist for her results. Pain management is who manages chronic pain. Pt verbalized understanding.  KP

## 2020-09-21 NOTE — Telephone Encounter (Signed)
She should call her [facility] Neurologist office for anything regarding the Lumbar Puncture.  We are not managing her chronic pain here. That will need to be done by Pain Management.  Saralyn Pilar, DO [facility] 09/21/2020, 2:50 PM

## 2020-09-30 LAB — IGG CSF INDEX
Albumin CSF-mCnc: 23 mg/dL (ref 7–29)
Albumin: 4 g/dL (ref 3.8–4.8)
CSF IgG Index: 0.6 (ref 0.0–0.7)
IgG (Immunoglobin G), Serum: 1009 mg/dL (ref 586–1602)
IgG, CSF: 3.3 mg/dL (ref 0.0–6.7)
IgG/Alb Ratio, CSF: 0.14 (ref 0.00–0.25)

## 2020-10-01 LAB — OLIGOCLONAL BANDS, CSF + SERM

## 2020-10-07 ENCOUNTER — Ambulatory Visit: Payer: Medicare HMO | Admitting: Family Medicine

## 2020-10-08 ENCOUNTER — Ambulatory Visit: Payer: Self-pay | Admitting: *Deleted

## 2020-10-08 NOTE — Telephone Encounter (Signed)
Yesterday I gave plasma.   When the needle was removed from my arm it had a blood clot on it.   The machine had stopped working.  There was a long blood clot from the insertion site. She told me to drink 2 gallons of water over the next 24-48 hours which I'm doing and to call my primary doctor.    No trouble getting the needle in.  It's only a very small bruise at the insertion site.  It's slightly puffy.   They stuck me on the side area of my arm on the outer side of her body on the right arm near my elbow.   No fever and body aches.     No numbness or tingling in right arm.  Using it normally.     I had an appt with Dr. Althea Charon yesterday but I missed my appt with him because I forgot what time my appt was for.  They rescheduled me for 10/19/2020.    I went over the s/s to watch for and to call us back if they occur.   Redness, swelling, drainage, knot develops, fever.     Reason for Disposition . [1] Needlestick AND [2] unused (e.g., new needle)    Blood clot on end of the needle when it was removed.   The machine stopped working and the blood clotted.   They weren't able to get it restarted.  Answer Assessment - Initial Assessment Questions 1. DEVICE: "What punctured the skin?"  (e.g., hollow injection needle, suture needle, knife blade, razor)     She gave plasma yesterday and the machine stopped.  When they removed the needle there was a long blood clot on it.   They told me to go home and drink 2 gallons of water over the next 24 hours to rehydrate which I'm doing.   I'm getting ready to start my 2nd gallon of water now.  They told me to call my dr and let them know what happened so that's why I'm calling. 2. LOCATION: "Where is the puncture located?"  (e.g., finger)     Right outer side of elbow area.  They did not have trouble getting the needle in. 3. DEPTH: "How deep do you think the puncture goes?"  (e.g., superficial)     It was in the vein but the machine stopped running  while I was donating the plasma and they couldn't get it restarted.  So they removed the needle. 4. ONSET: "When did the injury occur?" (e.g., minutes, hours, days)       Yesterday 10/07/2020 5. HOW OCCURRED: "How did this happen?"     Donating plasma.. 6. SOURCE BODY FLUID: "What body fluid were you exposed to?" (e.g., blood, spinal fluid, none)      None 7. SOURCE HIV-HEPATITIS: "Does the SOURCE person have Hepatitis or HIV?" (e.g., no; unknown; HIV+, Hepatitis+)     N/A 8. HEPATITIS B VACCINE: "Have you been fully vaccinated for Hepatitis B?"     N/A 9. TETANUS VACCINE: "Have you been fully vaccinated for tetanus?" "When was your last tetanus booster?"     N/A 10. PREGNANCY: "Is there any chance you are pregnant?" "When was your last menstrual period?"       Not asked  Protocols used: NEEDLESTICK-A-AH

## 2020-10-19 ENCOUNTER — Other Ambulatory Visit: Payer: Self-pay

## 2020-10-19 ENCOUNTER — Ambulatory Visit
Admission: RE | Admit: 2020-10-19 | Discharge: 2020-10-19 | Disposition: A | Discharge status: Home / Self Care | Payer: Medicare HMO | Attending: Family Medicine | Admitting: Family Medicine

## 2020-10-19 ENCOUNTER — Encounter: Payer: Self-pay | Admitting: Family Medicine

## 2020-10-19 ENCOUNTER — Ambulatory Visit
Admission: RE | Admit: 2020-10-19 | Discharge: 2020-10-19 | Disposition: A | Discharge status: Home / Self Care | Payer: Medicare HMO | Source: Ambulatory Visit | Attending: Family Medicine | Admitting: Family Medicine

## 2020-10-19 ENCOUNTER — Ambulatory Visit (INDEPENDENT_AMBULATORY_CARE_PROVIDER_SITE_OTHER): Payer: Medicare HMO | Admitting: Family Medicine

## 2020-10-19 VITALS — BP 105/53 | HR 82 | Ht 61.0 in | Wt 194.6 lb

## 2020-10-19 DIAGNOSIS — M797 Fibromyalgia: Secondary | ICD-10-CM

## 2020-10-19 DIAGNOSIS — M79605 Pain in left leg: Secondary | ICD-10-CM | POA: Insufficient documentation

## 2020-10-19 DIAGNOSIS — G894 Chronic pain syndrome: Secondary | ICD-10-CM | POA: Diagnosis not present

## 2020-10-19 DIAGNOSIS — I878 Other specified disorders of veins: Secondary | ICD-10-CM | POA: Diagnosis not present

## 2020-10-19 NOTE — Progress Notes (Signed)
Subjective:    Patient ID: Helen Sparks, female    DOB: [birth year], 35 y.o.   MRN: [id number]  Thomas Rhude is a 35 y.o. 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 She has some swelling of lower extremity, and some varicose veins.  Chronic Migraine Headaches Concern for abnormality on MRI History of Complex Regional Pain Syndrome Neurology updates She asks to clarify results of Lumbar Puncture - Zero oligoclonal bands observed in CSF on LP - She had recent missed apt with Nilda Calamity NP at [facility], and needs to re-schedule  Awaiting Pain Management referral new apt end of June 2022   Depression screen PHQ 2/9 10/19/2020  Decreased Interest 0  Down, Depressed, Hopeless 3  PHQ - 2 Score 3  Altered sleeping 0  Tired, decreased energy 2  Change in appetite 2  Feeling bad or failure about yourself  0  Trouble concentrating 0  Moving slowly or fidgety/restless 0  Suicidal thoughts 0  PHQ-9 Score 7  Difficult doing work/chores Not difficult at all    Social History   Tobacco Use   Smoking status: Every Day    Packs/day: 1.00    Years: 20.00    Pack years: 20.00    Types: Cigarettes   Smokeless tobacco: Never  Vaping Use   Vaping Use: Never used  Substance Use Topics   Alcohol use: Yes    Comment: 1 or 2 depending on pain   Drug use: Never    Review of Systems Per HPI unless specifically indicated above     Objective:    BP (!) 105/53   Pulse 82   Ht 5\' 1"  (1.549 m)   Wt 194 lb 9.6 oz (88.3 kg)   LMP 10/09/2020 (Approximate)   SpO2 100%   BMI 36.77 kg/m   Wt Readings from Last 3 Encounters:  10/19/20 194 lb 9.6 oz (88.3 kg)  09/18/20 183 lb (83 kg)  09/16/20 185 lb (83.9 kg)    Physical Exam Vitals and nursing note reviewed.  Constitutional:      General: She is not in  acute distress.    Appearance: Normal appearance. She is well-developed. She is not diaphoretic.     Comments: Well-appearing, comfortable, cooperative  HENT:     Head: Normocephalic and atraumatic.  Eyes:     General:        Right eye: No discharge.        Left eye: No discharge.     Conjunctiva/sclera: Conjunctivae normal.  Cardiovascular:     Rate and Rhythm: Normal rate.  Pulmonary:     Effort: Pulmonary effort is normal.  Musculoskeletal:        General: Tenderness (tender generalized left anterior leg) present.     Right lower leg: No edema.     Left lower leg: No edema (no pitting or focal edema, has some fullness appearance compared to R leg but not necessarily edema., several varicose veins).  Skin:    General: Skin is warm and dry.     Findings: No erythema or rash.  Neurological:     Mental Status: She is alert and oriented to person, place, and time.     Comments: Tired, resting on exam table bed when I came into room today. She was sleepy and drowsy during visit.  Psychiatric:        Mood and Affect: Mood normal.        Behavior: Behavior normal.        Thought Content: Thought content normal.     Comments: Well groomed, good eye contact, normal speech and thoughts    I have personally reviewed the radiology report from 10/19/20 on STAT L Leg X-ray TIB FIB.  CLINICAL DATA:  Left leg pain, no known injury, initial encounter   EXAM: LEFT TIBIA AND FIBULA - 2 VIEW   COMPARISON:  None.   FINDINGS: No acute fracture or dislocation is noted. Calcaneal spurring is seen. Vascular phleboliths are noted anteriorly. No gross soft tissue abnormality is noted.   IMPRESSION: No acute abnormality noted.     Electronically Signed   By: Alcide Clever M.D.   On: 10/19/2020 11:42 Results for orders placed or performed during the hospital encounter of 09/18/20  Pregnancy, urine  Result Value Ref Range   Preg Test, Ur NEGATIVE NEGATIVE  Glucose, CSF  Result Value Ref  Range   Glucose, CSF 53 40 - 70 mg/dL  Protein, CSF  Result Value Ref Range   Total  Protein, CSF 30 15 - 45 mg/dL  CSF cell count with differential  Result Value Ref Range   Tube # 3    Color, CSF COLORLESS COLORLESS   Appearance, CSF CLEAR CLEAR   Supernatant NOT INDICATED    RBC Count, CSF 17 (H) 0 - 3 /cu mm   WBC, CSF 3 0 - 5 /cu mm   Segmented Neutrophils-CSF 0 %   Lymphs, CSF 86 %   Monocyte-Macrophage-Spinal Fluid 14 %   Eosinophils, CSF 0 %  Oligoclonal bands, CSF + serm  Result Value Ref Range   CSF Oligoclonal Bands Comment   IgG CSF index  Result Value Ref Range   IgG, CSF 3.3 0.0 - 6.7 mg/dL   Albumin CSF-mCnc 23 7 - 29 mg/dL   IgG (Immunoglobin G), Serum 1,009 586 - 1,602 mg/dL   Albumin 4.0 3.8 - 4.8 g/dL   IgG/Alb Ratio, CSF 7.98 0.00 - 0.25   CSF IgG Index 0.6 0.0 - 0.7    MRI 08/01/20  CLINICAL DATA:  Chronic headache   EXAM: MRI HEAD WITHOUT AND WITH CONTRAST   TECHNIQUE: Multiplanar, multiecho pulse sequences of the brain and surrounding structures were obtained without and with intravenous contrast.   CONTRAST:  7.94mL GADAVIST GADOBUTROL 1 MMOL/ML IV SOLN   COMPARISON:  Head CT 08/01/2020   FINDINGS: Brain: No acute infarct, mass effect or extra-axial collection. No acute or chronic hemorrhage. Hyperintense T2-weighted signal is moderately widespread throughout the white matter. Parenchymal volume and CSF spaces are normal. The degree of white matter disease is advanced for age. The midline structures are normal. There is no abnormal contrast enhancement.   Vascular: Major flow voids are preserved.   Skull and upper cervical spine: Normal calvarium and skull base. Visualized upper cervical spine and soft tissues are normal.   Sinuses/Orbits:No paranasal sinus fluid levels or advanced mucosal thickening. No mastoid or middle ear effusion. Normal orbits.   IMPRESSION: 1. No acute intracranial abnormality. 2. Advanced white matter  disease for age, nonspecific. This may be seen in the setting of advanced chronic microvascular ischemia or a demyelinating process such as multiple sclerosis. 3. No contrast enhancing lesions that would indicate active demyelination.     Electronically Signed   By: Deatra Robinson M.D.   On: 08/01/2020 19:13  Assessment & Plan:   Problem List Items Addressed This Visit     Fibromyalgia   Chronic pain syndrome   Other Visit Diagnoses     Anterior leg pain, left    -  Primary   Relevant Orders   DG Tibia/Fibula Left (Completed)       L Leg Pain Anterior Uncertain course but seems recent problem, anterior pain only, no localized symptoms. No trauma or injury. Provoked by prolonged standing 12+ hour work shifts Will rule out stress fracture tib/fib shin injury today check X-ray STAT - results reviewed negative/normal Reassurance, RICE therapy, supportive medications for pain already on variety of meds Some varicose veins future consider Vascular consult Awaiting pain management apt.  Chronic Pain  /Fibromyalgia Upcoming Pain Management apt Continue Duloxetine, Lyrica. Also on Nortriptyline 10mg  nightly per Neuro for migraines  Neurology / Migraines Continue to follow-up Had prior abnormal CT > MRI, then now has done LP results recently with no oligoclonal bands, good news. She wanted to review this again today. She needs to re-schedule f/u with Neuro  No orders of the defined types were placed in this encounter.     Follow up plan: Return if symptoms worsen or fail to improve.    , DO [facility] 10/19/2020, 10:48 AM

## 2020-10-19 NOTE — Patient Instructions (Addendum)
Thank you for coming to the office today.  Use RICE therapy: - R - Rest / relative rest with activity modification avoid overuse of joint - I - Ice packs (make sure you use a towel or sock / something to protect skin) - C - Compression with flexible sleeve or ACE wrap to apply pressure and reduce swelling allowing more support - E - Elevation - if significant swelling, lift leg above heart level (toes above your nose) to help reduce swelling, most helpful at night after day of being on your feet  START anti inflammatory topical - OTC Voltaren (generic Diclofenac) topical 2-4 times a day as needed for pain swelling of affected joint for 1-2 weeks or longer.   Please schedule a Follow-up Appointment to: Return if symptoms worsen or fail to improve.  If you have any other questions or concerns, please feel free to call the office or send a message through MyChart. You may also schedule an earlier appointment if necessary.  Additionally, you may be receiving a survey about your experience at our office within a few days to 1 week by e-mail or mail. We value your feedback.  Saralyn Pilar, DO [facility], [place]

## 2020-10-26 ENCOUNTER — Telehealth: Payer: Self-pay | Admitting: Family Medicine

## 2020-10-26 NOTE — Telephone Encounter (Signed)
I would need more information from [facility] as to why her apt was re-schedule and if she can be seen 12/16/20 still.  If she decides that she cannot wait until 12/16/20, however we would need to know where to refer her - most places take several weeks to get in regardless, I am not confident we can find a new location before 12/16/20. But we could try if she prefers - however there are limited options left in [place] - otherwise it would be [place], [place], [place]. Most likely.  Saralyn Pilar, DO [facility] 10/26/2020, 4:47 PM

## 2020-10-26 NOTE — Telephone Encounter (Signed)
Pt is calling regarding her pain management appt that was scheduled 11/02/20 the appt was scheduled back in March. Pt appt was rescheduled until the end August. Pt is requesting a new referral and does not have to wait an extended period of time for an appt. CB- [phone]

## 2020-11-02 ENCOUNTER — Telehealth: Payer: Self-pay | Admitting: Family Medicine

## 2020-11-02 ENCOUNTER — Ambulatory Visit: Payer: Medicare HMO | Admitting: Pain Medicine

## 2020-11-02 DIAGNOSIS — Z205 Contact with and (suspected) exposure to viral hepatitis: Secondary | ICD-10-CM

## 2020-11-02 NOTE — Telephone Encounter (Signed)
She is here today for HEP C panel to confirm whether she has it or not. Her plasma donation center sent her because they did two tests on her and one was pos and the other wasneg. They sent her here to be tested.   Orders in for Hep B and C testing.  Saralyn Pilar, DO [facility] 11/02/2020, 10:01 AM

## 2020-11-06 LAB — HEPATITIS B SURFACE ANTIGEN: Hepatitis B Surface Ag: NONREACTIVE

## 2020-11-06 LAB — HEPATITIS B DNA, ULTRAQUANTITATIVE, PCR
Hepatitis B DNA (Calc): <1 Log IU/mL
Hepatitis B DNA: <10 IU/mL

## 2020-11-06 LAB — HEPATITIS C ANTIBODY
Hepatitis C Ab: REACTIVE — AB
SIGNAL TO CUT-OFF: 2.98 — ABNORMAL HIGH (ref <1.00)

## 2020-11-06 LAB — HCV RNA,QUANTITATIVE REAL TIME PCR
HCV Quantitative Log: <1.18 Log IU/mL
HCV RNA, PCR, QN: <15 IU/mL

## 2020-11-06 LAB — HEPATITIS B CORE ANTIBODY, TOTAL: Hep B Core Total Ab: NONREACTIVE

## 2020-12-03 ENCOUNTER — Telehealth: Payer: Self-pay

## 2020-12-03 NOTE — Telephone Encounter (Signed)
Copied from CRM [id number]. Topic: General - Other >> Dec 03, 2020  1:21 PM Glean Salen wrote: Reason for EFU:[id number] called for lab results from 07/1. Is it ok to give results? please call back

## 2020-12-03 NOTE — Telephone Encounter (Signed)
Patient called for lab results. Please call back.

## 2020-12-04 NOTE — Telephone Encounter (Signed)
Pt returned my call and she and I discussed the results of her recent labs. She wanted to make an appt for next week to f/u on them and possibly get a referral to OBGYN.

## 2020-12-04 NOTE — Telephone Encounter (Signed)
I called and left a message to call back to go over lab results.

## 2020-12-09 ENCOUNTER — Other Ambulatory Visit: Payer: Self-pay

## 2020-12-09 ENCOUNTER — Ambulatory Visit (INDEPENDENT_AMBULATORY_CARE_PROVIDER_SITE_OTHER): Payer: Medicare HMO | Admitting: Family Medicine

## 2020-12-09 ENCOUNTER — Ambulatory Visit: Payer: Medicare HMO | Admitting: Family Medicine

## 2020-12-09 ENCOUNTER — Encounter: Payer: Self-pay | Admitting: Family Medicine

## 2020-12-09 VITALS — BP 122/58 | HR 71 | Ht 62.0 in | Wt 189.8 lb

## 2020-12-09 DIAGNOSIS — M797 Fibromyalgia: Secondary | ICD-10-CM | POA: Diagnosis not present

## 2020-12-09 DIAGNOSIS — G894 Chronic pain syndrome: Secondary | ICD-10-CM

## 2020-12-09 DIAGNOSIS — Z124 Encounter for screening for malignant neoplasm of cervix: Secondary | ICD-10-CM | POA: Diagnosis not present

## 2020-12-09 DIAGNOSIS — G9050 Complex regional pain syndrome I, unspecified: Secondary | ICD-10-CM

## 2020-12-09 MED ORDER — METHOCARBAMOL 500 MG PO TABS: 500.0000 mg | ORAL_TABLET | Freq: Three times a day (TID) | ORAL | 2 refills | Status: DC | PRN

## 2020-12-09 MED ORDER — PREGABALIN 50 MG PO CAPS: 50.0000 mg | ORAL_CAPSULE | Freq: Three times a day (TID) | ORAL | 0 refills | Status: DC

## 2020-12-09 MED ORDER — DULOXETINE HCL 60 MG PO CPEP: 60.0000 mg | ORAL_CAPSULE | Freq: Every day | ORAL | 2 refills | Status: DC

## 2020-12-09 NOTE — Progress Notes (Signed)
Subjective:    Patient ID: Helen Sparks, female    DOB: 1985-06-02, 35 y.o.   MRN: [id number]  Helen Sparks is a 35 y.o. female presenting on 12/09/2020 for Contraception, Referral (OBGYN), and Fibromyalgia   HPI  Cervical CA Screening New partner with history of recent intercourse Last pap smear 1 year ago She is interested in future referral to GYN  CRPS / Fibromyalgia Chronic problem see prior HPI on initial visit 08/2020 She has run out of medication, was on Duloxetine 60mg , needs new order. Also out of Lyrica. Previously on Methocarbamol with PRN relief but needs new order. She is awaiting new patient apt with [facility] upcoming visit 12/16/20  Today she is asking for assistance with her rent and lost her job in May she is inquiring on any programs available to help avoid issues preventing her from paying her rent.    Depression screen [facility] 2/9 10/19/2020  Decreased Interest 0  Down, Depressed, Hopeless 3  PHQ - 2 Score 3  Altered sleeping 0  Tired, decreased energy 2  Change in appetite 2  Feeling bad or failure about yourself  0  Trouble concentrating 0  Moving slowly or fidgety/restless 0  Suicidal thoughts 0  PHQ-9 Score 7  Difficult doing work/chores Not difficult at all    Social History   Tobacco Use   Smoking status: Every Day    Packs/day: 1.00    Years: 20.00    Pack years: 20.00    Types: Cigarettes   Smokeless tobacco: Never  Vaping Use   Vaping Use: Never used  Substance Use Topics   Alcohol use: Yes    Comment: 1 or 2 depending on pain   Drug use: Never    Review of Systems Per HPI unless specifically indicated above     Objective:    BP (!) 122/58   Pulse 71   Ht 5\' 2"  (1.575 m)   Wt 189 lb 12.8 oz (86.1 kg)   SpO2 100%   BMI 34.71 kg/m   Wt Readings from Last 3 Encounters:  12/09/20 189 lb 12.8 oz (86.1 kg)  10/19/20 194 lb 9.6 oz (88.3 kg)  09/18/20 183 lb (83 kg)    Physical Exam Vitals and nursing note reviewed.   Constitutional:      General: She is not in acute distress.    Appearance: Normal appearance. She is well-developed. She is not diaphoretic.     Comments: Well-appearing, comfortable, cooperative  HENT:     Head: Normocephalic and atraumatic.  Eyes:     General:        Right eye: No discharge.        Left eye: No discharge.     Conjunctiva/sclera: Conjunctivae normal.  Cardiovascular:     Rate and Rhythm: Normal rate.  Pulmonary:     Effort: Pulmonary effort is normal.  Musculoskeletal:     Comments: Episode of muscle spasm and severe pain in office, left leg with loss of sensation and pain, difficulty standing and needing assistance until spasm and pain episode resolved. Able to walk out of office on her own.  Fingers and hands were splayed with some rigidity with the pain.  Skin:    General: Skin is warm and dry.     Findings: No erythema or rash.  Neurological:     Mental Status: She is alert and oriented to person, place, and time.  Psychiatric:        Mood and Affect:  Mood normal.        Behavior: Behavior normal.        Thought Content: Thought content normal.     Comments: Well groomed, good eye contact, normal speech and thoughts, tearful crying episode due to pain     Results for orders placed or performed in visit on 11/02/20  Hepatitis C antibody  Result Value Ref Range   Hepatitis C Ab REACTIVE (A) NON-REACTIVE   SIGNAL TO CUT-OFF 2.98 (H) <1.00  Hepatitis B Surface AntiGEN  Result Value Ref Range   Hepatitis B Surface Ag NON-REACTIVE NON-REACTIVE  Hepatitis B Core Antibody, total  Result Value Ref Range   Hep B Core Total Ab NON-REACTIVE NON-REACTIVE  Hepatitis B DNA, ultraquantitative, PCR  Result Value Ref Range   Hepatitis B DNA <10 IU/mL   Hepatitis B DNA (Calc) <1.00 Log IU/mL  HCV RNA, Quantitative Real Time PCR  Result Value Ref Range   HCV RNA, PCR, QN <15 NOT DETECTED NOT DETECTED IU/mL   HCV Quantitative Log <1.18 NOT DETECTED NOT DETECTED  Log IU/mL      Assessment & Plan:   Problem List Items Addressed This Visit     Fibromyalgia   Relevant Medications   DULoxetine (CYMBALTA) 60 MG capsule   methocarbamol (ROBAXIN) 500 MG tablet   pregabalin (LYRICA) 50 MG capsule   Other Relevant Orders   AMB Referral to [facility]   Chronic pain syndrome   Relevant Medications   DULoxetine (CYMBALTA) 60 MG capsule   methocarbamol (ROBAXIN) 500 MG tablet   pregabalin (LYRICA) 50 MG capsule   Other Relevant Orders   AMB Referral to [facility] Coordinaton   Other Visit Diagnoses     Complex regional pain syndrome type 1, affecting unspecified site    -  Primary   Relevant Medications   DULoxetine (CYMBALTA) 60 MG capsule   methocarbamol (ROBAXIN) 500 MG tablet   pregabalin (LYRICA) 50 MG capsule   Other Relevant Orders   AMB Referral to Community Care Coordinaton   Cervical cancer screening       Relevant Orders   Ambulatory referral to Obstetrics / Gynecology       # Chronic Pain Syndrome #CRPS #Fibromyalgia chronic history >15+ years of chronic pain syndrome, with CRPS various locations, Fibromyalgia, has complicated Neurological history with advanced white matter microvascular changes in brain, has migraine headaches, following now with Neurology, she has prior history of improvement on Lyrica, Duloxetine, Oxycodone.   PDMP reviewed  Uncertain reason for running out of duloxetine, will resubmit orders. Duloxetine 60mg  daily Agree to refill Methocarbamol muscle relaxant that was helpful Agree to refill Lyrica Will defer opiate rx at this time.  Discussed today that I am not able to manage her chronic pain medications long term at this time  She has upcoming apt with [facility] 12/16/20  Referral to Care Guides for financial barriers  Referral to GYN for pap   Orders Placed This Encounter  Procedures   Ambulatory referral to Obstetrics / Gynecology    Referral Priority:    Routine    Referral Type:   Consultation    Referral Reason:   Specialty Services Required    Requested Specialty:   Obstetrics and Gynecology    Number of Visits Requested:   1   AMB Referral to [facility] Coordinaton    Referral Priority:   Routine    Referral Type:   Consultation    Referral Reason:   Care  Coordination    Number of Visits Requested:   1      Meds ordered this encounter  Medications   DULoxetine (CYMBALTA) 60 MG capsule    Sig: Take 1 capsule (60 mg total) by mouth daily.    Dispense:  30 capsule    Refill:  2   methocarbamol (ROBAXIN) 500 MG tablet    Sig: Take 1 tablet (500 mg total) by mouth every 8 (eight) hours as needed for muscle spasms.    Dispense:  90 tablet    Refill:  2   pregabalin (LYRICA) 50 MG capsule    Sig: Take 1 capsule (50 mg total) by mouth 3 (three) times daily.    Dispense:  90 capsule    Refill:  0    Orders Placed This Encounter  Procedures   Ambulatory referral to Obstetrics / Gynecology    Referral Priority:   Routine    Referral Type:   Consultation    Referral Reason:   Specialty Services Required    Requested Specialty:   Obstetrics and Gynecology    Number of Visits Requested:   1   AMB Referral to Amb[facility] Coordinaton    Referral Priority:   Routine    Referral Type:   Consultation    Referral Reason:   Care Coordination    Number of Visits Requested:   1      Follow up plan: Return if symptoms worsen or fail to improve.   Saralyn Pilar, DO [facility] 12/09/2020, 11:38 AM

## 2020-12-09 NOTE — Patient Instructions (Addendum)
Thank you for coming to the office today.  Stay tuned for a  call from Social Work / Care Guides to help with your rent situation  GYN referral  Pain Management apt on 12/16/20 - 10am   Please schedule a Follow-up Appointment to: Return if symptoms worsen or fail to improve.  If you have any other questions or concerns, please feel free to call the office or send a message through MyChart. You may also schedule an earlier appointment if necessary.  Additionally, you may be receiving a survey about your experience at our office within a few days to 1 week by e-mail or mail. We value your feedback.  Saralyn Pilar, DO [facility], [place]

## 2020-12-14 ENCOUNTER — Other Ambulatory Visit: Payer: Self-pay

## 2020-12-14 ENCOUNTER — Encounter: Payer: Self-pay | Admitting: Emergency Medicine

## 2020-12-14 ENCOUNTER — Telehealth: Payer: Self-pay

## 2020-12-14 ENCOUNTER — Emergency Department: Payer: Medicare HMO

## 2020-12-14 ENCOUNTER — Emergency Department
Admission: EM | Admit: 2020-12-14 | Discharge: 2020-12-14 | Disposition: A | Discharge status: Home / Self Care | Payer: Medicare HMO | Attending: Emergency Medicine | Admitting: Emergency Medicine

## 2020-12-14 DIAGNOSIS — F17210 Nicotine dependence, cigarettes, uncomplicated: Secondary | ICD-10-CM | POA: Insufficient documentation

## 2020-12-14 DIAGNOSIS — I10 Essential (primary) hypertension: Secondary | ICD-10-CM | POA: Insufficient documentation

## 2020-12-14 DIAGNOSIS — M79641 Pain in right hand: Secondary | ICD-10-CM | POA: Diagnosis not present

## 2020-12-14 DIAGNOSIS — J45909 Unspecified asthma, uncomplicated: Secondary | ICD-10-CM | POA: Diagnosis not present

## 2020-12-14 DIAGNOSIS — M5441 Lumbago with sciatica, right side: Secondary | ICD-10-CM | POA: Diagnosis not present

## 2020-12-14 DIAGNOSIS — M545 Low back pain, unspecified: Secondary | ICD-10-CM | POA: Insufficient documentation

## 2020-12-14 DIAGNOSIS — M79644 Pain in right finger(s): Secondary | ICD-10-CM | POA: Diagnosis not present

## 2020-12-14 DIAGNOSIS — M21241 Flexion deformity, right finger joints: Secondary | ICD-10-CM | POA: Diagnosis not present

## 2020-12-14 DIAGNOSIS — G8929 Other chronic pain: Secondary | ICD-10-CM | POA: Insufficient documentation

## 2020-12-14 MED ORDER — KETOROLAC TROMETHAMINE 30 MG/ML IJ SOLN
30.0000 mg | Freq: Once | INTRAMUSCULAR | Status: AC
  Administered 2020-12-14: 30 mg via INTRAMUSCULAR
  Filled 2020-12-14: qty 1

## 2020-12-14 MED ORDER — OXYCODONE-ACETAMINOPHEN 5-325 MG PO TABS
1.0000 | ORAL_TABLET | Freq: Once | ORAL | Status: AC
  Administered 2020-12-14: 1 via ORAL
  Filled 2020-12-14: qty 1

## 2020-12-14 NOTE — ED Provider Notes (Signed)
[facility] Emergency Department Provider Note   ____________________________________________   Event Date/Time   First MD Initiated Contact with Patient 12/14/20 1302     (approximate)  I have reviewed the triage vital signs and the nursing notes.   HISTORY  Chief Complaint Hand Pain and Back Pain    HPI Helen Sparks is a 35 y.o. female with past medical history of asthma, hypertension, sciatica, and complex regional pain syndrome who presents to the ED complaining of hand pain.  Patient reports that she has had about 24 hours of pain at the base of her right middle finger.  She states that she bumped her finger at work yesterday and has had a hard time closing it since then.  She is concerned that the finger could be broken.  She does report chronic deformity of right pinky finger that is unchanged today.  She additionally complains of chronic pain in the middle of her lower back that has been going on for multiple months.  She reports previous diagnosis of sciatica and states pain radiates down her left leg.  She denies any urinary retention or incontinence, has not had any numbness or weakness in her lower extremities.  She has follow-up scheduled with a pain specialist later this week.        Past Medical History:  Diagnosis Date   Allergy    Anxiety    Asthma    Complex regional pain syndrome I    Depression    Fibromyalgia    Frequent headaches    Hypertension    pregnancy   Kidney stone    Sciatic leg pain    Stroke (HCC)    white spots on frontal lobe possible cva per pt    Patient Active Problem List   Diagnosis Date Noted   Obesity (BMI 35.0-39.9 without comorbidity) 08/11/2020   Chronic pain syndrome 08/11/2020   Fibromyalgia 08/11/2020   Migraine 08/11/2020    Past Surgical History:  Procedure Laterality Date   CESAREAN SECTION     2000, 2005   DENTAL SURGERY  2020    Prior to Admission medications   Medication Sig Start  Date End Date Taking? Authorizing Provider  diazepam (VALIUM) 2 MG tablet Take 1 tablet (2 mg total) by mouth every 8 (eight) hours as needed for anxiety. 08/31/20 08/31/21  Fisher, Roselyn Bering, PA-C  DULoxetine (CYMBALTA) 60 MG capsule Take 1 capsule (60 mg total) by mouth daily. 12/09/20   Karamalegos, Netta Neat, DO  ibuprofen (ADVIL) 800 MG tablet Take 1 tablet (800 mg total) by mouth every 8 (eight) hours as needed. 09/11/20   Karamalegos, Netta Neat, DO  lidocaine (LIDODERM) 5 % Place 1 patch onto the skin every 12 (twelve) hours. Remove & Discard patch within 12 hours or as directed by MD 09/16/20 09/16/21  Chesley Noon, MD  methocarbamol (ROBAXIN) 500 MG tablet Take 1 tablet (500 mg total) by mouth every 8 (eight) hours as needed for muscle spasms. 12/09/20   Karamalegos, Netta Neat, DO  nortriptyline (PAMELOR) 10 MG capsule Take 1 capsule (10 mg total) by mouth at bedtime. Increase to 20 mg nightly after 1 week 09/16/20 09/16/21  Chesley Noon, MD  pregabalin (LYRICA) 50 MG capsule Take 1 capsule (50 mg total) by mouth 3 (three) times daily. 12/09/20   Smitty Cords, DO    Allergies Bactrim [sulfamethoxazole-trimethoprim] and Penicillins  No family history on file.  Social History Social History   Tobacco Use  Smoking status: Every Day    Packs/day: 1.00    Years: 20.00    Pack years: 20.00    Types: Cigarettes   Smokeless tobacco: Never  Vaping Use   Vaping Use: Never used  Substance Use Topics   Alcohol use: Yes    Comment: 1 or 2 depending on pain   Drug use: Never    Review of Systems  Constitutional: No fever/chills Eyes: No visual changes. ENT: No sore throat. Cardiovascular: Denies chest pain. Respiratory: Denies shortness of breath. Gastrointestinal: No abdominal pain.  No nausea, no vomiting.  No diarrhea.  No constipation. Genitourinary: Negative for dysuria. Musculoskeletal: Positive for back pain.  Positive for right hand pain. Skin: Negative for  rash. Neurological: Negative for headaches, focal weakness or numbness.  ____________________________________________   PHYSICAL EXAM:  VITAL SIGNS: ED Triage Vitals [12/14/20 1207]  Enc Vitals Group     BP 126/83     Pulse Rate 87     Resp 20     Temp 98.2 F (36.8 C)     Temp Source Oral     SpO2      Weight 187 lb (84.8 kg)     Height 5\' 2"  (1.575 m)     Head Circumference      Peak Flow      Pain Score      Pain Loc      Pain Edu?      Excl. in GC?     Constitutional: Alert and oriented. Eyes: Conjunctivae are normal. Head: Atraumatic. Nose: No congestion/rhinnorhea. Mouth/Throat: Mucous membranes are moist. Neck: Normal ROM Cardiovascular: Normal rate, regular rhythm. Grossly normal heart sounds. Respiratory: Normal respiratory effort.  No retractions. Lungs CTAB. Gastrointestinal: Soft and nontender. No distention. Genitourinary: deferred Musculoskeletal: Tenderness to palpation at right third MCP and PIP with no obvious deformity.  Chronic flexion deformity at right fifth PIP.  No lower extremity tenderness nor edema. Neurologic:  Normal speech and language. No gross focal neurologic deficits are appreciated. Skin:  Skin is warm, dry and intact. No rash noted. Psychiatric: Mood and affect are normal. Speech and behavior are normal.  ____________________________________________   LABS (all labs ordered are listed, but only abnormal results are displayed)  Labs Reviewed - No data to display   PROCEDURES  Procedure(s) performed (including Critical Care):  Procedures   ____________________________________________   INITIAL IMPRESSION / ASSESSMENT AND PLAN / ED COURSE      35 year old female with past medical history of asthma, hypertension, sciatica, and chronic pain syndrome who presents to the ED complaining of pain at the base of her right third finger since yesterday.  X-ray reviewed by me and shows chronic flexion deformity at right fifth  finger but no acute fracture or dislocation in the middle finger.  Patient is neurovascularly intact distally in her right hand.  She additionally complains of chronic low back pain, no concerning features today to necessitate imaging.  She is appropriate for discharge home with follow-up at pain specialist, also provided with resource guide for financial assistance as she is concerned she will soon be evicted from her home.      ____________________________________________   FINAL CLINICAL IMPRESSION(S) / ED DIAGNOSES  Final diagnoses:  Right hand pain  Chronic midline low back pain with left-sided sciatica     ED Discharge Orders     None        Note:  This document was prepared using Dragon voice recognition software and may include unintentional  dictation errors.    Chesley Noon, MD 12/14/20 1357

## 2020-12-14 NOTE — Telephone Encounter (Signed)
Error

## 2020-12-14 NOTE — TOC Initial Note (Signed)
Transition of Care [facility]) - Initial/Assessment Note    Patient Details  Name: Helen Sparks MRN: [id number] Date of Birth: [birth year]  Transition of [facility] CM/SW Contact:    Marina Goodell Phone Number: [phone] 12/14/2020, 3:44 PM  Clinical Narrative:                  Patient presents to [facility] due to right hand pain and back pain.  CSW was contacted by [facility] requesting that I speak with patient about resources because the patient was facing eviction.  CSW spoke with patient, and discussed different resources available for her and patient stated she would contact DSS, Health Department and Legal Aide.   Barriers to Discharge: Financial Resources   Patient Goals and CMS Choice        Expected Discharge Plan and Services                                                Prior Living Arrangements/Services                       Activities of Daily Living      Permission Sought/Granted                  Emotional Assessment              Admission diagnosis:  rt hand and back pain Patient Active Problem List   Diagnosis Date Noted   Obesity (BMI 35.0-39.9 without comorbidity) 08/11/2020   Chronic pain syndrome 08/11/2020   Fibromyalgia 08/11/2020   Migraine 08/11/2020   PCP:  Smitty Cords, DO Pharmacy:   [facility] [phone] [place], [place] - [address] AT [facility] [address] [place] [address] Phone: [phone] Fax: [phone]     Social Determinants of Health (SDOH) Interventions    Readmission Risk Interventions No flowsheet data found.

## 2020-12-14 NOTE — Progress Notes (Deleted)
Patient: Helen Sparks  Service Category: E/M  Provider: Gaspar Cola, MD  DOB: 1985-07-29  DOS: 12/16/2020  Referring Provider: Nobie Putnam *  MRN: [id number]  Setting: Ambulatory outpatient  PCP: Olin Hauser, DO  Type: New Patient  Specialty: Interventional Pain Management    Location: Office  Delivery: Face-to-face     Primary Reason(s) for Visit: Encounter for initial evaluation of one or more chronic problems (new to examiner) potentially causing chronic pain, and posing a threat to normal musculoskeletal function. (Level of risk: High) CC: No chief complaint on file.  HPI  Helen Sparks is a 35 y.o. year old, female patient, who comes for the first time to our practice referred by Nobie Putnam * for our initial evaluation of her chronic pain. She has Obesity (BMI 35.0-39.9 without comorbidity); Chronic pain syndrome; Fibromyalgia; and Migraine on their problem list. Today she comes in for evaluation of her No chief complaint on file.  Pain Assessment: Location:     Radiating:   Onset:   Duration:   Quality:   Severity:  /10 (subjective, self-reported pain score)  Effect on ADL:   Timing:   Modifying factors:   BP:    HR:    Onset and Duration: {Hx; Onset and Duration:210120511} Cause of pain: {Hx; Cause:210120521} Severity: {Pain Severity:210120502} Timing: {Symptoms; Timing:210120501} Aggravating Factors: {Causes; Aggravating pain factors:210120507} Alleviating Factors: {Causes; Alleviating Factors:210120500} Associated Problems: {Hx; Associated problems:210120515} Quality of Pain: {Hx; Symptom quality or Descriptor:210120531} Previous Examinations or Tests: {Hx; Previous examinations or test:210120529} Previous Treatments: {Hx; Previous Treatment:210120503}  ***  Today I took the time to provide the patient with information regarding my pain practice. The patient was informed that my practice is divided into two sections: an interventional  pain management section, as well as a completely separate and distinct medication management section. I explained that I have procedure days for my interventional therapies, and evaluation days for follow-ups and medication management. Because of the amount of documentation required during both, they are kept separated. This means that there is the possibility that she may be scheduled for a procedure on one day, and medication management the next. I have also informed her that because of staffing and facility limitations, I no longer take patients for medication management only. To illustrate the reasons for this, I gave the patient the example of surgeons, and how inappropriate it would be to refer a patient to his/her care, just to write for the post-surgical antibiotics on a surgery done by a different surgeon.   Because interventional pain management is my board-certified specialty, the patient was informed that joining my practice means that they are open to any and all interventional therapies. I made it clear that this does not mean that they will be forced to have any procedures done. What this means is that I believe interventional therapies to be essential part of the diagnosis and proper management of chronic pain conditions. Therefore, patients not interested in these interventional alternatives will be better served under the care of a different practitioner.  The patient was also made aware of my Comprehensive Pain Management Safety Guidelines where by joining my practice, they limit all of their nerve blocks and joint injections to those done by our practice, for as long as we are retained to manage their care.   Historic Controlled Substance Pharmacotherapy Review  PMP and historical list of controlled substances: Pregabalin 50 mg capsule, 1 capsule 3 times daily; oxycodone/APAP 5/325 1 4 times daily (09/01/2020); diazepam  2 mg tablet, 1 tab p.o. 3 times daily; hydrocodone/APAP 5/325 1 tab  p.o. 3 times daily (12/20/2019); oxycodone/APAP 10/325 1 tab p.o. daily (11/05/2019). Current opioid analgesics: None MME/day: 0 mg/day  Historical Monitoring: The patient  reports no history of drug use. List of all UDS Test(s): No results found for: MDMA, COCAINSCRNUR, [place], [place], CANNABQUANT, THCU, [place] List of other Serum/Urine Drug Screening Test(s):  No results found for: AMPHSCRSER, BARBSCRSER, BENZOSCRSER, COCAINSCRSER, COCAINSCRNUR, PCPSCRSER, PCPQUANT, THCSCRSER, THCU, CANNABQUANT, OPIATESCRSER, OXYSCRSER, PROPOXSCRSER, ETH Historical Background Evaluation: [place] PMP: PDMP reviewed during this encounter. Online review of the past 52-monthperiod conducted.             PMP NARX Score Report:  Narcotic: 240 Sedative: 281 Stimulant: 000 [place] Department of public safety, offender search: (Editor, commissioningInformation) Non-contributory Risk Assessment Profile: Aberrant behavior: None observed or detected today Risk factors for fatal opioid overdose: None identified today PMP NARX Overdose Risk Score: 490 Fatal overdose hazard ratio (HR): Calculation deferred Non-fatal overdose hazard ratio (HR): Calculation deferred Risk of opioid abuse or dependence: 0.7-3.0% with doses ? 36 MME/day and 6.1-26% with doses ? 120 MME/day. Substance use disorder (SUD) risk level: See below Personal History of Substance Abuse (SUD-Substance use disorder):  Alcohol:    Illegal Drugs:    Rx Drugs:    ORT Risk Level calculation:    ORT Scoring interpretation table:  Score <3 = Low Risk for SUD  Score between 4-7 = Moderate Risk for SUD  Score >8 = High Risk for Opioid Abuse   PHQ-2 Depression Scale:  Total score:    PHQ-2 Scoring interpretation table: (Score and probability of major depressive disorder)  Score 0 = No depression  Score 1 = 15.4% Probability  Score 2 = 21.1% Probability  Score 3 = 38.4% Probability  Score 4 = 45.5% Probability  Score 5 = 56.4% Probability  Score 6 = 78.6% Probability    PHQ-9 Depression Scale:  Total score:    PHQ-9 Scoring interpretation table:  Score 0-4 = No depression  Score 5-9 = Mild depression  Score 10-14 = Moderate depression  Score 15-19 = Moderately severe depression  Score 20-27 = Severe depression (2.4 times higher risk of SUD and 2.89 times higher risk of overuse)   Pharmacologic Plan: As per protocol, I have not taken over any controlled substance management, pending the results of ordered tests and/or consults.            Initial impression: Pending review of available data and ordered tests.  Meds   Current Outpatient Medications:    diazepam (VALIUM) 2 MG tablet, Take 1 tablet (2 mg total) by mouth every 8 (eight) hours as needed for anxiety., Disp: 15 tablet, Rfl: 0   DULoxetine (CYMBALTA) 60 MG capsule, Take 1 capsule (60 mg total) by mouth daily., Disp: 30 capsule, Rfl: 2   ibuprofen (ADVIL) 800 MG tablet, Take 1 tablet (800 mg total) by mouth every 8 (eight) hours as needed., Disp: 30 tablet, Rfl: 0   lidocaine (LIDODERM) 5 %, Place 1 patch onto the skin every 12 (twelve) hours. Remove & Discard patch within 12 hours or as directed by MD, Disp: 10 patch, Rfl: 0   methocarbamol (ROBAXIN) 500 MG tablet, Take 1 tablet (500 mg total) by mouth every 8 (eight) hours as needed for muscle spasms., Disp: 90 tablet, Rfl: 2   nortriptyline (PAMELOR) 10 MG capsule, Take 1 capsule (10 mg total) by mouth at bedtime. Increase to 20 mg nightly  after 1 week, Disp: 30 capsule, Rfl: 2   pregabalin (LYRICA) 50 MG capsule, Take 1 capsule (50 mg total) by mouth 3 (three) times daily., Disp: 90 capsule, Rfl: 0  Imaging Review  Hand Imaging: Hand-R DG Complete: Results for orders placed during the hospital encounter of 12/14/20 DG Hand Complete Right  Narrative CLINICAL DATA:  Chronic regional pain syndrome type I, unable to close hand, finger distortion  EXAM: RIGHT HAND - COMPLETE 3+ VIEW  COMPARISON:  None  FINDINGS: Osseous mineralization  normal.  Flexion deformity at PIP joint of RIGHT little finger.  Joint spaces preserved.  No acute fracture, dislocation, or bone destruction.  IMPRESSION: Flexion deformity at PIP joint of RIGHT little finger.  No additional osseous findings.   Electronically Signed By: Lavonia Dana M.D. On: 12/14/2020 13:03  Complexity Note: Imaging results reviewed. Results shared with Helen Sparks, using Layman's terms.                        ROS  Cardiovascular: {Hx; Cardiovascular History:210120525} Pulmonary or Respiratory: {Hx; Pumonary and/or Respiratory History:210120523} Neurological: {Hx; Neurological:210120504} Psychological-Psychiatric: {Hx; Psychological-Psychiatric History:210120512} Gastrointestinal: {Hx; Gastrointestinal:210120527} Genitourinary: {Hx; Genitourinary:210120506} Hematological: {Hx; Hematological:210120510} Endocrine: {Hx; Endocrine history:210120509} Rheumatologic: {Hx; Rheumatological:210120530} Musculoskeletal: {Hx; Musculoskeletal:210120528} Work History: {Hx; Work history:210120514}  Allergies  Helen Sparks is allergic to bactrim [sulfamethoxazole-trimethoprim] and penicillins.  Laboratory Chemistry Profile   Renal Lab Results  Component Value Date   BUN 10 08/31/2020   CREATININE 0.73 08/31/2020   GFRNONAA >60 08/31/2020   PROTEINUR NEGATIVE 08/31/2020     Electrolytes Lab Results  Component Value Date   NA 134 (L) 08/31/2020   K 3.9 08/31/2020   CL 103 08/31/2020   CALCIUM 9.1 08/31/2020     Hepatic Lab Results  Component Value Date   AST 25 08/31/2020   ALT 13 08/31/2020   ALBUMIN 4.0 09/18/2020   ALKPHOS 60 08/31/2020     ID Lab Results  Component Value Date   PREGTESTUR NEGATIVE 09/18/2020     Bone No results found for: [place], HM094BS9GGE, ZM6294TM5, YY5035WS5, 25OHVITD1, 25OHVITD2, 25OHVITD3, TESTOFREE, TESTOSTERONE   Endocrine Lab Results  Component Value Date   GLUCOSE 88 08/31/2020   GLUCOSEU NEGATIVE 08/31/2020      Neuropathy No results found for: VITAMINB12, FOLATE, HGBA1C, HIV   CNS Lab Results  Component Value Date   COLORCSF COLORLESS 09/18/2020   APPEARCSF CLEAR 09/18/2020   RBCCOUNTCSF 17 (H) 09/18/2020   WBCCSF 3 09/18/2020   POLYSCSF 0 09/18/2020   LYMPHSCSF 86 09/18/2020   EOSCSF 0 09/18/2020   PROTEINCSF 30 09/18/2020   GLUCCSF 53 09/18/2020   CSFOLI Comment 09/18/2020   IGGCSF 3.3 09/18/2020     Inflammation (CRP: Acute  ESR: Chronic) No results found for: CRP, ESRSEDRATE, LATICACIDVEN   Rheumatology No results found for: RF, ANA, LABURIC, URICUR, LYMEIGGIGMAB, LYMEABIGMQN, HLAB27   Coagulation Lab Results  Component Value Date   PLT 250 08/31/2020     Cardiovascular Lab Results  Component Value Date   CKTOTAL 155 08/01/2020   HGB 13.3 08/31/2020   HCT 39.8 08/31/2020     Screening Lab Results  Component Value Date   PREGTESTUR NEGATIVE 09/18/2020     Cancer No results found for: CEA, CA125, LABCA2   Allergens No results found for: ALMOND, APPLE, ASPARAGUS, AVOCADO, BANANA, BARLEY, BASIL, BAYLEAF, GREENBEAN, LIMABEAN, WHITEBEAN, BEEFIGE, REDBEET, BLUEBERRY, BROCCOLI, CABBAGE, MELON, CARROT, CASEIN, CASHEWNUT, CAULIFLOWER, CELERY     Note: Lab results reviewed.  [place]  Drug: Helen Sparks  reports no history of drug use. Alcohol:  reports current alcohol use. Tobacco:  reports that she has been smoking cigarettes. She has a 20.00 pack-year smoking history. She has never used smokeless tobacco. Medical:  has a past medical history of Allergy, Anxiety, Asthma, Complex regional pain syndrome I, Depression, Fibromyalgia, Frequent headaches, Hypertension, Kidney stone, Sciatic leg pain, and Stroke ([place]). Family: family history is not on file.  Past Surgical History:  Procedure Laterality Date   CESAREAN SECTION     2000, 2005   DENTAL SURGERY  2020   Active Ambulatory Problems    Diagnosis Date Noted   Obesity (BMI 35.0-39.9 without comorbidity) 08/11/2020    Chronic pain syndrome 08/11/2020   Fibromyalgia 08/11/2020   Migraine 08/11/2020   Resolved Ambulatory Problems    Diagnosis Date Noted   Complex regional pain syndrome I    Past Medical History:  Diagnosis Date   Allergy    Anxiety    Asthma    Depression    Frequent headaches    Hypertension    Kidney stone    Sciatic leg pain    Stroke ([place])    Constitutional Exam  General appearance: Well nourished, well developed, and well hydrated. In no apparent acute distress There were no vitals filed for this visit. BMI Assessment: Estimated body mass index is 34.2 kg/m as calculated from the following:   Height as of 12/14/20: 5' 2" (1.575 m).   Weight as of 12/14/20: 187 lb (84.8 kg).  BMI interpretation table: BMI level Category Range association with higher incidence of chronic pain  <18 kg/m2 Underweight   18.5-24.9 kg/m2 Ideal body weight   25-29.9 kg/m2 Overweight Increased incidence by 20%  30-34.9 kg/m2 Obese (Class I) Increased incidence by 68%  35-39.9 kg/m2 Severe obesity (Class II) Increased incidence by 136%  >40 kg/m2 Extreme obesity (Class III) Increased incidence by 254%   Patient's current BMI Ideal Body weight  There is no height or weight on file to calculate BMI. Ideal body weight: 50.1 kg (110 lb 7.2 oz) Adjusted ideal body weight: 64 kg (141 lb 1.1 oz)   BMI Readings from Last 4 Encounters:  12/14/20 34.20 kg/m  12/09/20 34.71 kg/m  10/19/20 36.77 kg/m  09/18/20 34.58 kg/m   Wt Readings from Last 4 Encounters:  12/14/20 187 lb (84.8 kg)  12/09/20 189 lb 12.8 oz (86.1 kg)  10/19/20 194 lb 9.6 oz (88.3 kg)  09/18/20 183 lb (83 kg)    Psych/Mental status: Alert, oriented x 3 (person, place, & time)       Eyes: PERLA Respiratory: No evidence of acute respiratory distress  Assessment  Primary Diagnosis & Pertinent Problem List: There were no encounter diagnoses.  Visit Diagnosis (New problems to examiner): No diagnosis found. Plan of Care  (Initial workup plan)  Note: Helen Sparks was reminded that as per protocol, today's visit has been an evaluation only. We have not taken over the patient's controlled substance management.  Problem-specific plan: No problem-specific Assessment & Plan notes found for this encounter. Lab Orders  No laboratory test(s) ordered today   Imaging Orders  No imaging studies ordered today   Referral Orders  No referral(s) requested today   Procedure Orders    No procedure(s) ordered today   Pharmacotherapy (current): Medications ordered:  No orders of the defined types were placed in this encounter.  Medications administered during this visit: Helen Sparks had no medications administered during this visit.   Pharmacological  management options:  Opioid Analgesics: The patient was informed that there is no guarantee that she would be a candidate for opioid analgesics. The decision will be made following CDC guidelines. This decision will be based on the results of diagnostic studies, as well as Helen Sparks's risk profile.   Membrane stabilizer: To be determined at a later time  Muscle relaxant: To be determined at a later time  NSAID: To be determined at a later time  Other analgesic(s): To be determined at a later time   Interventional management options: Helen Sparks was informed that there is no guarantee that she would be a candidate for interventional therapies. The decision will be based on the results of diagnostic studies, as well as Helen Sparks's risk profile.  Procedure(s) under consideration:  Pending results of ordered studies    Interventional Therapies  Risk  Complexity Considerations:   Estimated body mass index is 34.2 kg/m as calculated from the following:   Height as of 12/14/20: 5' 2" (1.575 m).   Weight as of 12/14/20: 187 lb (84.8 kg). WNL   Planned  Pending:   Pending further evaluation   Under consideration:   ***   Completed:   None at this time   Therapeutic   Palliative (PRN) options:   None established   Provider-requested follow-up: No follow-ups on file.  Future Appointments  Date Time Provider [place]  12/16/2020 10:00 AM Milinda Pointer, MD [facility] None    Note by: Gaspar Cola, MD Date: 12/16/2020; Time: 8:57 PM

## 2020-12-14 NOTE — ED Triage Notes (Signed)
Pt reports that she has chronic regional pain syndrome type one. Her right middle finger is distorted and and she is unable to close her hand. No known injury. She is also complaining back pain from her CRPS. Pt is tearful in triage.

## 2020-12-14 NOTE — ED Notes (Signed)
See triage note  Presents with right hand pain and back pain  States she thinks she may have broken her finger  Unable to close her hand  Pt is tearful on arrival

## 2020-12-14 NOTE — Telephone Encounter (Signed)
   Telephone encounter was:  Unsuccessful.  12/14/2020 Name: Helen Sparks MRN: [id number] DOB: 19-Dec-1985  Unsuccessful outbound call made today to assist with:  Financial Difficulties related to rent  Outreach Attempt:  1st Attempt  A HIPAA compliant voice message was left requesting a return call.  Instructed patient to call back at [phone].  Ty Oshima, AAS Paralegal, [facility] Care Guide  Embedded Care Coordination [place]  Care Management  [address] [place], [place] [address] ??[email or web address]  ?? [phone]   [email or web address]

## 2020-12-16 ENCOUNTER — Ambulatory Visit: Payer: Medicare HMO | Admitting: Pain Medicine

## 2020-12-16 DIAGNOSIS — Z79899 Other long term (current) drug therapy: Secondary | ICD-10-CM | POA: Insufficient documentation

## 2020-12-16 DIAGNOSIS — M899 Disorder of bone, unspecified: Secondary | ICD-10-CM | POA: Insufficient documentation

## 2020-12-16 DIAGNOSIS — Z789 Other specified health status: Secondary | ICD-10-CM | POA: Insufficient documentation

## 2020-12-24 ENCOUNTER — Telehealth: Payer: Self-pay

## 2020-12-24 NOTE — Telephone Encounter (Signed)
   Telephone encounter was:  Successful.  12/24/2020 Name: Helen Sparks MRN: [id number] DOB: 12-05-1985  Helen Sparks is a 35 y.o. year old female who is a primary care patient of Smitty Cords, DO . The community resource team was consulted for assistance with  rent assistance.  Care guide performed the following interventions: Received completed  w-9 form from landlord Will Spence at [facility] submitted request for funds to [facility] and attached w-9.  Sent message to Vella Kohler to let her know request was submitted.   Follow Up Plan:  Care guide will follow up with patient by phone over the next 7 days  Michele Judy, AAS Paralegal, [facility] Care Guide  Embedded Care Coordination [facility]  Care Management  [address] [place], [place] [address] ??[email or web address]  ?? [phone]   [email or web address]

## 2020-12-24 NOTE — Telephone Encounter (Signed)
   Telephone encounter was:  Successful.  12/24/2020 Name: Kamsiyochukwu Buist MRN: [id number] DOB: 04/22/1986  Shaneen Reeser is a 35 y.o. year old female who is a primary care patient of Smitty Cords, DO . The community resource team was consulted for assistance with  rent assistance.  Care guide performed the following interventions: Spoke with patient completed information to submit a request for funds  to  [facility].  Emailed w-9 form to patient's landlord at [email or web address] and cc'd patient. I will submit the request to [facility] when I receive the completed w-9 form to attach.   Follow Up Plan:  Care guide will follow up with patient by phone over the next 7 days  Glennis Borger, AAS Paralegal, [facility] Care Guide  Embedded Care Coordination [facility]  Care Management  [address] [place], [place] [address] ??[email or web address]  ?? [phone]   [email or web address]

## 2020-12-24 NOTE — Telephone Encounter (Signed)
   Telephone encounter was:  Successful.  12/24/2020 Name: Karlea Mckibbin MRN: [id number] DOB: [birth year]  Helen Sparks is a 35 y.o. year old female who is a primary care patient of Smitty Cords, DO . The community resource team was consulted for assistance with  rent assistance.  Care guide performed the following interventions: Received email confirmation from [facility] at [facility] that a request has been sent to [employer] for $500.  Money will be paid directly to [facility].  Called patient to update her.   Follow Up Plan:  No further follow up planned at this time. The patient has been provided with needed resources. and informed patient that [employer] will send patient directly to [facility].  Patient is aware of community resources and has contacted them.  Kandice Schmelter, AAS Paralegal, [facility] Care Guide  Embedded Care Coordination [place]  Care Management  [address] [place], [place] [address] ??[email or web address]  ?? [phone]   [email or web address]

## 2020-12-25 ENCOUNTER — Emergency Department
Admission: EM | Admit: 2020-12-25 | Discharge: 2020-12-25 | Disposition: A | Discharge status: Home / Self Care | Payer: Medicare HMO | Attending: Emergency Medicine | Admitting: Emergency Medicine

## 2020-12-25 ENCOUNTER — Other Ambulatory Visit: Payer: Self-pay

## 2020-12-25 DIAGNOSIS — J45909 Unspecified asthma, uncomplicated: Secondary | ICD-10-CM | POA: Insufficient documentation

## 2020-12-25 DIAGNOSIS — F17210 Nicotine dependence, cigarettes, uncomplicated: Secondary | ICD-10-CM | POA: Diagnosis not present

## 2020-12-25 DIAGNOSIS — I10 Essential (primary) hypertension: Secondary | ICD-10-CM | POA: Diagnosis not present

## 2020-12-25 DIAGNOSIS — Z79899 Other long term (current) drug therapy: Secondary | ICD-10-CM | POA: Insufficient documentation

## 2020-12-25 DIAGNOSIS — E119 Type 2 diabetes mellitus without complications: Secondary | ICD-10-CM | POA: Insufficient documentation

## 2020-12-25 DIAGNOSIS — M65331 Trigger finger, right middle finger: Secondary | ICD-10-CM | POA: Diagnosis not present

## 2020-12-25 DIAGNOSIS — G90511 Complex regional pain syndrome I of right upper limb: Secondary | ICD-10-CM | POA: Diagnosis not present

## 2020-12-25 DIAGNOSIS — M65341 Trigger finger, right ring finger: Secondary | ICD-10-CM | POA: Diagnosis not present

## 2020-12-25 DIAGNOSIS — M79644 Pain in right finger(s): Secondary | ICD-10-CM | POA: Diagnosis present

## 2020-12-25 MED ORDER — HYDROCODONE-ACETAMINOPHEN 5-325 MG PO TABS
1.0000 | ORAL_TABLET | ORAL | Status: AC
  Administered 2020-12-25: 1 via ORAL
  Filled 2020-12-25: qty 1

## 2020-12-25 MED ORDER — PREDNISONE 10 MG PO TABS: 10.0000 mg | ORAL_TABLET | Freq: Every day | ORAL | 0 refills | Status: DC

## 2020-12-25 MED ORDER — DEXAMETHASONE SODIUM PHOSPHATE 10 MG/ML IJ SOLN
10.0000 mg | Freq: Once | INTRAMUSCULAR | Status: AC
  Administered 2020-12-25: 10 mg via INTRAMUSCULAR
  Filled 2020-12-25: qty 1
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
## 2020-12-25 NOTE — ED Triage Notes (Signed)
Pt comes with c/o finger swelling.

## 2020-12-25 NOTE — ED Notes (Signed)
Patient denies pain, swelling, or itcing at injection site. Patient ambulatory with steady gait. Resp even and unlabored. NADN.

## 2020-12-25 NOTE — Discharge Instructions (Addendum)
Take steroid taper as prescribed.  Call orthopedic office Monday to schedule follow-up appointment.  Return to the ER for any worsening symptoms or urgent changes in health

## 2020-12-25 NOTE — ED Notes (Signed)
ED Provider at bedside. 

## 2020-12-28 ENCOUNTER — Other Ambulatory Visit: Payer: Self-pay | Admitting: Family Medicine

## 2020-12-28 DIAGNOSIS — G894 Chronic pain syndrome: Secondary | ICD-10-CM

## 2020-12-28 NOTE — Telephone Encounter (Signed)
  Notes to clinic:  Medication last filled on 09/11/2020 for 30 day supply Review for continued use and refill    Requested Prescriptions  Pending Prescriptions Disp Refills   ibuprofen (ADVIL) 800 MG tablet [Pharmacy Med Name: IBUPROFEN 800MG  TABLETS] 30 tablet 0    Sig: TAKE 1 TABLET(800 MG) BY MOUTH EVERY 8 HOURS AS NEEDED     Analgesics:  NSAIDS Passed - 12/28/2020 10:11 AM      Passed - Cr in normal range and within 360 days    Creatinine, Ser  Date Value Ref Range Status  08/31/2020 0.73 0.44 - 1.00 mg/dL Final          Passed - HGB in normal range and within 360 days    Hemoglobin  Date Value Ref Range Status  08/31/2020 13.3 12.0 - 15.0 g/dL Final          Passed - Patient is not pregnant      Passed - Valid encounter within last 12 months    Recent Outpatient Visits           2 weeks ago Complex regional pain syndrome type 1, affecting unspecified site   [facility] [place], [place], DO   2 months ago Anterior leg pain, left   [facility] [place], [place], DO   4 months ago Chronic pain syndrome   [facility], [place], Netta Neat

## 2020-12-29 ENCOUNTER — Telehealth: Payer: Self-pay

## 2020-12-29 NOTE — Telephone Encounter (Signed)
   Telephone encounter was:  Successful.  12/29/2020 Name: Helen Sparks MRN: [id number] DOB: 1986-01-04  Helen Sparks is a 35 y.o. year old female who is a primary care patient of Smitty Cords, DO . The community resource team was consulted for assistance with  rent assistance.  Care guide performed the following interventions: Received email from [facility] a check has been cut and will be mailed to [facility] to be applied towards the patient's rent balance.   Follow Up Plan:  No further follow up planned at this time. The patient has been provided with needed resources.  Theador Jezewski, AAS Paralegal, [facility] Care Guide  Embedded Care Coordination [place]  Care Management  [address] [place], [place] [address] ??[email or web address]  ?? [phone]   [email or web address]

## 2021-01-06 ENCOUNTER — Ambulatory Visit (INDEPENDENT_AMBULATORY_CARE_PROVIDER_SITE_OTHER): Payer: Medicare HMO | Admitting: Obstetrics and Gynecology

## 2021-01-06 ENCOUNTER — Other Ambulatory Visit (HOSPITAL_COMMUNITY)
Admission: RE | Admit: 2021-01-06 | Discharge: 2021-01-06 | Disposition: A | Discharge status: Home / Self Care | Payer: Medicare HMO | Source: Ambulatory Visit | Attending: Obstetrics and Gynecology | Admitting: Obstetrics and Gynecology

## 2021-01-06 ENCOUNTER — Encounter: Payer: Self-pay | Admitting: Obstetrics and Gynecology

## 2021-01-06 ENCOUNTER — Other Ambulatory Visit: Payer: Self-pay

## 2021-01-06 VITALS — BP 118/81 | HR 82 | Resp 16 | Ht 61.5 in | Wt 191.5 lb

## 2021-01-06 DIAGNOSIS — A599 Trichomoniasis, unspecified: Secondary | ICD-10-CM | POA: Insufficient documentation

## 2021-01-06 DIAGNOSIS — N72 Inflammatory disease of cervix uteri: Secondary | ICD-10-CM | POA: Diagnosis not present

## 2021-01-06 DIAGNOSIS — Z8742 Personal history of other diseases of the female genital tract: Secondary | ICD-10-CM

## 2021-01-06 DIAGNOSIS — R87618 Other abnormal cytological findings on specimens from cervix uteri: Secondary | ICD-10-CM | POA: Diagnosis present

## 2021-01-06 DIAGNOSIS — Z1151 Encounter for screening for human papillomavirus (HPV): Secondary | ICD-10-CM | POA: Insufficient documentation

## 2021-01-06 DIAGNOSIS — Z01411 Encounter for gynecological examination (general) (routine) with abnormal findings: Secondary | ICD-10-CM | POA: Diagnosis not present

## 2021-01-06 DIAGNOSIS — B977 Papillomavirus as the cause of diseases classified elsewhere: Secondary | ICD-10-CM
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
## 2021-01-07 LAB — CYTOLOGY - PAP
Comment: NEGATIVE
Diagnosis: NEGATIVE
High risk HPV: NEGATIVE

## 2021-01-08 ENCOUNTER — Other Ambulatory Visit: Payer: Self-pay | Admitting: Obstetrics and Gynecology

## 2021-01-08 DIAGNOSIS — A599 Trichomoniasis, unspecified: Secondary | ICD-10-CM

## 2021-01-08 MED ORDER — METRONIDAZOLE 500 MG PO TABS: ORAL_TABLET | ORAL | 0 refills | Status: DC
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
## 2021-02-08 ENCOUNTER — Other Ambulatory Visit: Payer: Self-pay

## 2021-02-08 ENCOUNTER — Encounter: Payer: Self-pay | Admitting: Pain Medicine

## 2021-02-08 ENCOUNTER — Ambulatory Visit: Payer: Medicare HMO | Attending: Pain Medicine | Admitting: Pain Medicine

## 2021-02-08 VITALS — BP 117/84 | HR 82 | Temp 97.3°F | Resp 14 | Ht 61.0 in | Wt 191.0 lb

## 2021-02-08 DIAGNOSIS — N941 Unspecified dyspareunia: Secondary | ICD-10-CM

## 2021-02-08 DIAGNOSIS — M542 Cervicalgia: Secondary | ICD-10-CM | POA: Insufficient documentation

## 2021-02-08 DIAGNOSIS — M549 Dorsalgia, unspecified: Secondary | ICD-10-CM | POA: Diagnosis not present

## 2021-02-08 DIAGNOSIS — M25511 Pain in right shoulder: Secondary | ICD-10-CM

## 2021-02-08 DIAGNOSIS — G894 Chronic pain syndrome: Secondary | ICD-10-CM | POA: Diagnosis not present

## 2021-02-08 DIAGNOSIS — M25512 Pain in left shoulder: Secondary | ICD-10-CM

## 2021-02-08 DIAGNOSIS — R52 Pain, unspecified: Secondary | ICD-10-CM

## 2021-02-08 DIAGNOSIS — Z79899 Other long term (current) drug therapy: Secondary | ICD-10-CM

## 2021-02-08 DIAGNOSIS — T23121S Burn of first degree of single right finger (nail) except thumb, sequela: Secondary | ICD-10-CM | POA: Diagnosis present

## 2021-02-08 DIAGNOSIS — M899 Disorder of bone, unspecified: Secondary | ICD-10-CM

## 2021-02-08 DIAGNOSIS — R519 Headache, unspecified: Secondary | ICD-10-CM

## 2021-02-08 DIAGNOSIS — M545 Low back pain, unspecified: Secondary | ICD-10-CM | POA: Diagnosis not present

## 2021-02-08 DIAGNOSIS — F32A Depression, unspecified: Secondary | ICD-10-CM

## 2021-02-08 DIAGNOSIS — M79642 Pain in left hand: Secondary | ICD-10-CM

## 2021-02-08 DIAGNOSIS — M79601 Pain in right arm: Secondary | ICD-10-CM | POA: Diagnosis not present

## 2021-02-08 DIAGNOSIS — Z789 Other specified health status: Secondary | ICD-10-CM

## 2021-02-08 DIAGNOSIS — M79602 Pain in left arm: Secondary | ICD-10-CM | POA: Diagnosis present

## 2021-02-08 DIAGNOSIS — G8929 Other chronic pain: Secondary | ICD-10-CM | POA: Insufficient documentation

## 2021-02-08 DIAGNOSIS — F4312 Post-traumatic stress disorder, chronic: Secondary | ICD-10-CM | POA: Diagnosis present

## 2021-02-08 DIAGNOSIS — M79605 Pain in left leg: Secondary | ICD-10-CM | POA: Diagnosis not present

## 2021-02-08 DIAGNOSIS — G4486 Cervicogenic headache: Secondary | ICD-10-CM | POA: Diagnosis not present

## 2021-02-08 DIAGNOSIS — M79641 Pain in right hand: Secondary | ICD-10-CM

## 2021-02-08 DIAGNOSIS — R102 Pelvic and perineal pain: Secondary | ICD-10-CM

## 2021-02-08 NOTE — Progress Notes (Signed)
Safety precautions to be maintained throughout the outpatient stay will include: orient to surroundings, keep bed in low position, maintain call bell within reach at all times, provide assistance with transfer out of bed and ambulation.  

## 2021-02-09 DIAGNOSIS — G894 Chronic pain syndrome: Secondary | ICD-10-CM | POA: Diagnosis not present

## 2021-02-09 DIAGNOSIS — M899 Disorder of bone, unspecified: Secondary | ICD-10-CM | POA: Diagnosis not present

## 2021-02-14 LAB — COMPLIANCE DRUG ANALYSIS, UR

## 2021-02-18 ENCOUNTER — Other Ambulatory Visit: Payer: Self-pay

## 2021-02-18 ENCOUNTER — Emergency Department
Admission: EM | Admit: 2021-02-18 | Discharge: 2021-02-18 | Disposition: A | Discharge status: Home / Self Care | Payer: Medicare HMO | Attending: Emergency Medicine | Admitting: Emergency Medicine

## 2021-02-18 DIAGNOSIS — M797 Fibromyalgia: Secondary | ICD-10-CM | POA: Insufficient documentation

## 2021-02-18 DIAGNOSIS — M542 Cervicalgia: Secondary | ICD-10-CM | POA: Diagnosis not present

## 2021-02-18 DIAGNOSIS — G9050 Complex regional pain syndrome I, unspecified: Secondary | ICD-10-CM | POA: Diagnosis not present

## 2021-02-18 DIAGNOSIS — F17210 Nicotine dependence, cigarettes, uncomplicated: Secondary | ICD-10-CM | POA: Diagnosis not present

## 2021-02-18 DIAGNOSIS — Z79899 Other long term (current) drug therapy: Secondary | ICD-10-CM | POA: Insufficient documentation

## 2021-02-18 DIAGNOSIS — G894 Chronic pain syndrome: Secondary | ICD-10-CM | POA: Diagnosis not present

## 2021-02-18 DIAGNOSIS — J45909 Unspecified asthma, uncomplicated: Secondary | ICD-10-CM | POA: Insufficient documentation

## 2021-02-18 DIAGNOSIS — I10 Essential (primary) hypertension: Secondary | ICD-10-CM | POA: Insufficient documentation

## 2021-02-18 MED ORDER — TRAMADOL HCL 50 MG PO TABS
50.0000 mg | ORAL_TABLET | Freq: Once | ORAL | Status: AC
  Administered 2021-02-18: 50 mg via ORAL
  Filled 2021-02-18: qty 1

## 2021-02-18 MED ORDER — HYDROCODONE-ACETAMINOPHEN 5-325 MG PO TABS: 1.0000 | ORAL_TABLET | Freq: Three times a day (TID) | ORAL | 0 refills | Status: DC | PRN

## 2021-02-18 MED ORDER — IBUPROFEN 800 MG PO TABS: ORAL_TABLET | ORAL | 1 refills | Status: DC

## 2021-02-18 MED ORDER — METHOCARBAMOL 500 MG PO TABS: 500.0000 mg | ORAL_TABLET | Freq: Three times a day (TID) | ORAL | 2 refills | Status: DC | PRN

## 2021-02-18 MED ORDER — PREGABALIN 50 MG PO CAPS: 50.0000 mg | ORAL_CAPSULE | Freq: Three times a day (TID) | ORAL | 0 refills | Status: DC

## 2021-02-18 MED ORDER — KETOROLAC TROMETHAMINE 30 MG/ML IJ SOLN
30.0000 mg | Freq: Once | INTRAMUSCULAR | Status: AC
  Administered 2021-02-18: 30 mg via INTRAMUSCULAR
  Filled 2021-02-18: qty 1

## 2021-02-18 NOTE — Discharge Instructions (Addendum)
Take the prescription meds as directed.  °

## 2021-02-18 NOTE — ED Triage Notes (Signed)
Pt states that she has base of her neck pain that shoots down into her lower back. Pt has hx of CRPS and fibromyalgia but states that this pain feel different. Pt states at work she has to look down a lot and feels like the pain has come from this. Pt is ambulatory.
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
## 2021-02-22 ENCOUNTER — Other Ambulatory Visit: Payer: Self-pay | Admitting: Pain Medicine

## 2021-02-22 ENCOUNTER — Other Ambulatory Visit: Payer: Self-pay | Admitting: Family Medicine

## 2021-02-22 ENCOUNTER — Other Ambulatory Visit: Payer: Self-pay | Admitting: Internal Medicine

## 2021-02-22 ENCOUNTER — Ambulatory Visit
Admission: RE | Admit: 2021-02-22 | Discharge: 2021-02-22 | Disposition: A | Discharge status: Home / Self Care | Payer: Medicare HMO | Attending: Pain Medicine | Admitting: Pain Medicine

## 2021-02-22 ENCOUNTER — Ambulatory Visit
Admission: RE | Admit: 2021-02-22 | Discharge: 2021-02-22 | Disposition: A | Discharge status: Home / Self Care | Payer: Medicare HMO | Source: Ambulatory Visit | Attending: Pain Medicine | Admitting: Pain Medicine

## 2021-02-22 DIAGNOSIS — M545 Low back pain, unspecified: Secondary | ICD-10-CM | POA: Insufficient documentation

## 2021-02-22 DIAGNOSIS — M549 Dorsalgia, unspecified: Secondary | ICD-10-CM

## 2021-02-22 DIAGNOSIS — M25511 Pain in right shoulder: Secondary | ICD-10-CM | POA: Diagnosis not present

## 2021-02-22 DIAGNOSIS — M542 Cervicalgia: Secondary | ICD-10-CM | POA: Insufficient documentation

## 2021-02-22 DIAGNOSIS — M5136 Other intervertebral disc degeneration, lumbar region: Secondary | ICD-10-CM | POA: Diagnosis not present

## 2021-02-22 DIAGNOSIS — M79642 Pain in left hand: Secondary | ICD-10-CM | POA: Diagnosis present

## 2021-02-22 DIAGNOSIS — M546 Pain in thoracic spine: Secondary | ICD-10-CM | POA: Diagnosis not present

## 2021-02-22 DIAGNOSIS — G4486 Cervicogenic headache: Secondary | ICD-10-CM | POA: Insufficient documentation

## 2021-02-22 DIAGNOSIS — M79601 Pain in right arm: Secondary | ICD-10-CM | POA: Insufficient documentation

## 2021-02-22 DIAGNOSIS — G8929 Other chronic pain: Secondary | ICD-10-CM

## 2021-02-22 DIAGNOSIS — M79641 Pain in right hand: Secondary | ICD-10-CM | POA: Diagnosis present

## 2021-02-22 DIAGNOSIS — M79602 Pain in left arm: Secondary | ICD-10-CM | POA: Insufficient documentation

## 2021-02-22 DIAGNOSIS — M25512 Pain in left shoulder: Secondary | ICD-10-CM | POA: Insufficient documentation

## 2021-02-22 DIAGNOSIS — M79605 Pain in left leg: Secondary | ICD-10-CM | POA: Insufficient documentation

## 2021-02-22 DIAGNOSIS — M4807 Spinal stenosis, lumbosacral region: Secondary | ICD-10-CM | POA: Diagnosis not present

## 2021-02-22 DIAGNOSIS — G894 Chronic pain syndrome: Secondary | ICD-10-CM

## 2021-02-22 DIAGNOSIS — M5137 Other intervertebral disc degeneration, lumbosacral region: Secondary | ICD-10-CM | POA: Diagnosis not present

## 2021-02-22 DIAGNOSIS — M797 Fibromyalgia: Secondary | ICD-10-CM

## 2021-02-22 DIAGNOSIS — G9050 Complex regional pain syndrome I, unspecified: Secondary | ICD-10-CM

## 2021-02-22 DIAGNOSIS — M19042 Primary osteoarthritis, left hand: Secondary | ICD-10-CM | POA: Diagnosis not present

## 2021-02-22 DIAGNOSIS — M50323 Other cervical disc degeneration at C6-C7 level: Secondary | ICD-10-CM | POA: Diagnosis not present

## 2021-02-23 NOTE — Telephone Encounter (Signed)
Requested medications are due for refill today yes  Requested medications are on the active medication list yes  Last refill 12/09/20  Last visit 12/09/20  Future visit scheduled no  Notes to clinic Was last ordered in ED, rx transmission failed, med not delegated, please assess.

## 2021-02-23 NOTE — Telephone Encounter (Signed)
Requested medications are due for refill today yes  Requested medications are on the active medication list yes  Last refill 12/28/20  Last visit office visit 8/3, ED visit 10/13  Future visit scheduled no  Notes to clinic transmission failed, please assess.

## 2021-02-24 ENCOUNTER — Telehealth: Payer: Self-pay | Admitting: Physician Assistant

## 2021-02-24 DIAGNOSIS — G9050 Complex regional pain syndrome I, unspecified: Secondary | ICD-10-CM

## 2021-02-24 DIAGNOSIS — M797 Fibromyalgia: Secondary | ICD-10-CM

## 2021-02-24 DIAGNOSIS — G894 Chronic pain syndrome: Secondary | ICD-10-CM

## 2021-02-24 MED ORDER — HYDROCODONE-ACETAMINOPHEN 5-325 MG PO TABS: 1.0000 | ORAL_TABLET | Freq: Three times a day (TID) | ORAL | 0 refills | Status: DC | PRN

## 2021-02-24 MED ORDER — METHOCARBAMOL 500 MG PO TABS: 500.0000 mg | ORAL_TABLET | Freq: Three times a day (TID) | ORAL | 2 refills | Status: DC | PRN

## 2021-02-24 MED ORDER — PREGABALIN 100 MG PO CAPS: 100.0000 mg | ORAL_CAPSULE | Freq: Two times a day (BID) | ORAL | 1 refills | Status: DC

## 2021-02-24 MED ORDER — LIDOCAINE 5 % EX PTCH: 1.0000 | MEDICATED_PATCH | Freq: Two times a day (BID) | CUTANEOUS | 0 refills | Status: DC

## 2021-02-24 NOTE — Telephone Encounter (Cosign Needed)
Patient called back to ED for report that RXs had not been transmitted.   Called pharmacy to confirm, and notified that system was down and pharmacy closed on day of visit.   Prescriptions have been re-transmitted to pharmacy as requested.

## 2021-03-24 ENCOUNTER — Emergency Department (HOSPITAL_COMMUNITY)
Admission: EM | Admit: 2021-03-24 | Discharge: 2021-03-24 | Disposition: A | Discharge status: Home / Self Care | Payer: Medicare HMO | Attending: Emergency Medicine | Admitting: Emergency Medicine

## 2021-03-24 ENCOUNTER — Emergency Department (HOSPITAL_COMMUNITY): Payer: Medicare HMO

## 2021-03-24 ENCOUNTER — Other Ambulatory Visit: Payer: Self-pay

## 2021-03-24 ENCOUNTER — Encounter (HOSPITAL_COMMUNITY): Payer: Self-pay | Admitting: Emergency Medicine

## 2021-03-24 DIAGNOSIS — I10 Essential (primary) hypertension: Secondary | ICD-10-CM | POA: Diagnosis not present

## 2021-03-24 DIAGNOSIS — R109 Unspecified abdominal pain: Secondary | ICD-10-CM | POA: Diagnosis not present

## 2021-03-24 DIAGNOSIS — R1031 Right lower quadrant pain: Secondary | ICD-10-CM | POA: Diagnosis not present

## 2021-03-24 DIAGNOSIS — N9489 Other specified conditions associated with female genital organs and menstrual cycle: Secondary | ICD-10-CM | POA: Diagnosis not present

## 2021-03-24 DIAGNOSIS — J45909 Unspecified asthma, uncomplicated: Secondary | ICD-10-CM | POA: Diagnosis not present

## 2021-03-24 DIAGNOSIS — F17210 Nicotine dependence, cigarettes, uncomplicated: Secondary | ICD-10-CM | POA: Diagnosis not present

## 2021-03-24 DIAGNOSIS — Z9851 Tubal ligation status: Secondary | ICD-10-CM | POA: Diagnosis not present

## 2021-03-24 DIAGNOSIS — N200 Calculus of kidney: Secondary | ICD-10-CM | POA: Diagnosis not present

## 2021-03-24 LAB — COMPREHENSIVE METABOLIC PANEL
ALT: 11 U/L (ref 0–44)
AST: 32 U/L (ref 15–41)
Albumin: 3.8 g/dL (ref 3.5–5.0)
Alkaline Phosphatase: 64 U/L (ref 38–126)
Anion gap: 6 (ref 5–15)
BUN: 7 mg/dL (ref 6–20)
CO2: 26 mmol/L (ref 22–32)
Calcium: 8.8 mg/dL — ABNORMAL LOW (ref 8.9–10.3)
Chloride: 106 mmol/L (ref 98–111)
Creatinine, Ser: 0.58 mg/dL (ref 0.44–1.00)
GFR, Estimated: >60 mL/min (ref >60)
Glucose, Bld: 86 mg/dL (ref 70–99)
Potassium: 4.4 mmol/L (ref 3.5–5.1)
Sodium: 138 mmol/L (ref 135–145)
Total Bilirubin: 0.8 mg/dL (ref 0.3–1.2)
Total Protein: 7.5 g/dL (ref 6.5–8.1)

## 2021-03-24 LAB — URINALYSIS, ROUTINE W REFLEX MICROSCOPIC
Bilirubin Urine: NEGATIVE
Glucose, UA: NEGATIVE mg/dL
Ketones, ur: NEGATIVE mg/dL
Leukocytes,Ua: NEGATIVE
Nitrite: NEGATIVE
Protein, ur: NEGATIVE mg/dL
Specific Gravity, Urine: 1.008 (ref 1.005–1.030)
pH: 7 (ref 5.0–8.0)

## 2021-03-24 LAB — CBC WITH DIFFERENTIAL/PLATELET
Abs Immature Granulocytes: 0.02 10*3/uL (ref 0.00–0.07)
Basophils Absolute: 0 10*3/uL (ref 0.0–0.1)
Basophils Relative: 1 %
Eosinophils Absolute: 0.2 10*3/uL (ref 0.0–0.5)
Eosinophils Relative: 3 %
HCT: 34.5 % — ABNORMAL LOW (ref 36.0–46.0)
Hemoglobin: 10.8 g/dL — ABNORMAL LOW (ref 12.0–15.0)
Immature Granulocytes: 0 %
Lymphocytes Relative: 29 %
Lymphs Abs: 2.6 10*3/uL (ref 0.7–4.0)
MCH: 26.9 pg (ref 26.0–34.0)
MCHC: 31.3 g/dL (ref 30.0–36.0)
MCV: 86 fL (ref 80.0–100.0)
Monocytes Absolute: 0.7 10*3/uL (ref 0.1–1.0)
Monocytes Relative: 8 %
Neutro Abs: 5.2 10*3/uL (ref 1.7–7.7)
Neutrophils Relative %: 59 %
Platelets: 316 10*3/uL (ref 150–400)
RBC: 4.01 MIL/uL (ref 3.87–5.11)
RDW: 15.1 % (ref 11.5–15.5)
WBC: 8.7 10*3/uL (ref 4.0–10.5)
nRBC: 0 % (ref 0.0–0.2)

## 2021-03-24 LAB — I-STAT BETA HCG BLOOD, ED (MC, WL, AP ONLY): I-stat hCG, quantitative: <5 m[IU]/mL (ref <5)

## 2021-03-24 MED ORDER — TRAMADOL HCL 50 MG PO TABS: 50.0000 mg | ORAL_TABLET | Freq: Four times a day (QID) | ORAL | 0 refills | Status: DC | PRN

## 2021-03-24 MED ORDER — SODIUM CHLORIDE 0.9 % IV BOLUS
1000.0000 mL | Freq: Once | INTRAVENOUS | Status: AC
  Administered 2021-03-24: 1000 mL via INTRAVENOUS

## 2021-03-24 MED ORDER — ONDANSETRON HCL 4 MG/2ML IJ SOLN
4.0000 mg | Freq: Once | INTRAMUSCULAR | Status: AC
  Administered 2021-03-24: 4 mg via INTRAVENOUS
  Filled 2021-03-24: qty 2

## 2021-03-24 MED ORDER — MORPHINE SULFATE (PF) 4 MG/ML IV SOLN
4.0000 mg | Freq: Once | INTRAVENOUS | Status: AC
  Administered 2021-03-24: 4 mg via INTRAVENOUS
  Filled 2021-03-24: qty 1

## 2021-03-24 MED ORDER — KETOROLAC TROMETHAMINE 15 MG/ML IJ SOLN
15.0000 mg | Freq: Once | INTRAMUSCULAR | Status: AC
  Administered 2021-03-24: 15 mg via INTRAVENOUS
  Filled 2021-03-24: qty 1

## 2021-03-24 NOTE — ED Triage Notes (Signed)
Per pt, complaining of right and left flank pain-states she is having urinary frequency-history of kidney stone

## 2021-03-24 NOTE — Discharge Instructions (Signed)
Return for any problem.   Use ibuprofen as instructed.   Drink plenty of fluids.
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
## 2021-04-06 ENCOUNTER — Inpatient Hospital Stay: Payer: Medicare HMO | Admitting: Family Medicine

## 2021-04-08 ENCOUNTER — Encounter (HOSPITAL_COMMUNITY): Payer: Self-pay | Admitting: Emergency Medicine

## 2021-04-08 ENCOUNTER — Other Ambulatory Visit: Payer: Self-pay

## 2021-04-08 ENCOUNTER — Emergency Department (HOSPITAL_COMMUNITY)
Admission: EM | Admit: 2021-04-08 | Discharge: 2021-04-08 | Disposition: A | Discharge status: Home / Self Care | Payer: Medicare HMO | Attending: Emergency Medicine | Admitting: Emergency Medicine

## 2021-04-08 DIAGNOSIS — J45909 Unspecified asthma, uncomplicated: Secondary | ICD-10-CM | POA: Diagnosis not present

## 2021-04-08 DIAGNOSIS — I10 Essential (primary) hypertension: Secondary | ICD-10-CM | POA: Insufficient documentation

## 2021-04-08 DIAGNOSIS — F17210 Nicotine dependence, cigarettes, uncomplicated: Secondary | ICD-10-CM | POA: Insufficient documentation

## 2021-04-08 DIAGNOSIS — K529 Noninfective gastroenteritis and colitis, unspecified: Secondary | ICD-10-CM

## 2021-04-08 DIAGNOSIS — R1084 Generalized abdominal pain: Secondary | ICD-10-CM | POA: Diagnosis present

## 2021-04-08 LAB — I-STAT BETA HCG BLOOD, ED (MC, WL, AP ONLY): I-stat hCG, quantitative: <5 m[IU]/mL (ref <5)

## 2021-04-08 LAB — URINALYSIS, ROUTINE W REFLEX MICROSCOPIC
Bilirubin Urine: NEGATIVE
Glucose, UA: NEGATIVE mg/dL
Ketones, ur: 5 mg/dL — AB
Leukocytes,Ua: NEGATIVE
Nitrite: NEGATIVE
Protein, ur: NEGATIVE mg/dL
Specific Gravity, Urine: 1.023 (ref 1.005–1.030)
pH: 6 (ref 5.0–8.0)

## 2021-04-08 LAB — COMPREHENSIVE METABOLIC PANEL
ALT: 13 U/L (ref 0–44)
AST: 26 U/L (ref 15–41)
Albumin: 4.3 g/dL (ref 3.5–5.0)
Alkaline Phosphatase: 74 U/L (ref 38–126)
Anion gap: 6 (ref 5–15)
BUN: 8 mg/dL (ref 6–20)
CO2: 25 mmol/L (ref 22–32)
Calcium: 9 mg/dL (ref 8.9–10.3)
Chloride: 105 mmol/L (ref 98–111)
Creatinine, Ser: 0.68 mg/dL (ref 0.44–1.00)
GFR, Estimated: >60 mL/min (ref >60)
Glucose, Bld: 79 mg/dL (ref 70–99)
Potassium: 3.8 mmol/L (ref 3.5–5.1)
Sodium: 136 mmol/L (ref 135–145)
Total Bilirubin: 0.4 mg/dL (ref 0.3–1.2)
Total Protein: 8 g/dL (ref 6.5–8.1)

## 2021-04-08 LAB — CBC
HCT: 39.9 % (ref 36.0–46.0)
Hemoglobin: 12.4 g/dL (ref 12.0–15.0)
MCH: 26.8 pg (ref 26.0–34.0)
MCHC: 31.1 g/dL (ref 30.0–36.0)
MCV: 86.2 fL (ref 80.0–100.0)
Platelets: 355 10*3/uL (ref 150–400)
RBC: 4.63 MIL/uL (ref 3.87–5.11)
RDW: 14.6 % (ref 11.5–15.5)
WBC: 7.9 10*3/uL (ref 4.0–10.5)
nRBC: 0 % (ref 0.0–0.2)

## 2021-04-08 LAB — LIPASE, BLOOD: Lipase: 29 U/L (ref 11–51)

## 2021-04-08 MED ORDER — ONDANSETRON HCL 4 MG PO TABS: 4.0000 mg | ORAL_TABLET | Freq: Four times a day (QID) | ORAL | 0 refills | Status: DC

## 2021-04-08 MED ORDER — ONDANSETRON 8 MG PO TBDP
8.0000 mg | ORAL_TABLET | Freq: Once | ORAL | Status: AC
  Administered 2021-04-08: 8 mg via ORAL
  Filled 2021-04-08: qty 1

## 2021-04-08 MED ORDER — ALUM & MAG HYDROXIDE-SIMETH 200-200-20 MG/5ML PO SUSP
30.0000 mL | Freq: Once | ORAL | Status: AC
  Administered 2021-04-08: 30 mL via ORAL
  Filled 2021-04-08: qty 30

## 2021-04-08 MED ORDER — LIDOCAINE VISCOUS HCL 2 % MT SOLN
15.0000 mL | Freq: Once | OROMUCOSAL | Status: AC
  Administered 2021-04-08: 15 mL via ORAL
  Filled 2021-04-08: qty 15

## 2021-04-08 NOTE — ED Triage Notes (Signed)
Pt reports eating fast food last night and started feeling nauseous, having diarrhea, and lower abdominal cramping. Denies fevers or chills.

## 2021-04-08 NOTE — Discharge Instructions (Addendum)
Take Zofran every 6 hours as needed for nausea and vomiting.  You can take Tylenol for the pain, continue to drink plenty of fluids as help keep you hydrated.  Do not take any antidiarrheal medicines you want to flush out the infection.  This should pass in the next 24 to 48 hours.  If the pain worsens with the diarrhea persist for greater than 72 hours return back to the ED for additional work-up as needed.  Information provided above for primary care doctor.  Please call and establish care when you are able.
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
## 2021-04-08 NOTE — ED Notes (Signed)
Patient declined vital signs.
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
## 2021-04-12 ENCOUNTER — Ambulatory Visit: Payer: Medicare HMO | Attending: Pain Medicine | Admitting: Pain Medicine

## 2021-04-12 ENCOUNTER — Other Ambulatory Visit: Payer: Self-pay

## 2021-04-12 ENCOUNTER — Encounter: Payer: Self-pay | Admitting: Pain Medicine

## 2021-04-12 VITALS — BP 127/83 | HR 80 | Temp 97.3°F | Resp 18 | Ht 62.0 in | Wt 191.0 lb

## 2021-04-12 DIAGNOSIS — M79605 Pain in left leg: Secondary | ICD-10-CM | POA: Diagnosis not present

## 2021-04-12 DIAGNOSIS — T23121S Burn of first degree of single right finger (nail) except thumb, sequela: Secondary | ICD-10-CM | POA: Insufficient documentation

## 2021-04-12 DIAGNOSIS — N200 Calculus of kidney: Secondary | ICD-10-CM | POA: Diagnosis not present

## 2021-04-12 DIAGNOSIS — G8929 Other chronic pain: Secondary | ICD-10-CM | POA: Diagnosis not present

## 2021-04-12 DIAGNOSIS — M25511 Pain in right shoulder: Secondary | ICD-10-CM | POA: Diagnosis not present

## 2021-04-12 DIAGNOSIS — M542 Cervicalgia: Secondary | ICD-10-CM | POA: Insufficient documentation

## 2021-04-12 DIAGNOSIS — G4486 Cervicogenic headache: Secondary | ICD-10-CM | POA: Insufficient documentation

## 2021-04-12 DIAGNOSIS — M5382 Other specified dorsopathies, cervical region: Secondary | ICD-10-CM | POA: Insufficient documentation

## 2021-04-12 DIAGNOSIS — G894 Chronic pain syndrome: Secondary | ICD-10-CM | POA: Insufficient documentation

## 2021-04-12 DIAGNOSIS — M545 Low back pain, unspecified: Secondary | ICD-10-CM | POA: Insufficient documentation

## 2021-04-12 DIAGNOSIS — M5030 Other cervical disc degeneration, unspecified cervical region: Secondary | ICD-10-CM | POA: Insufficient documentation

## 2021-04-12 DIAGNOSIS — R519 Headache, unspecified: Secondary | ICD-10-CM | POA: Insufficient documentation

## 2021-04-12 DIAGNOSIS — M25512 Pain in left shoulder: Secondary | ICD-10-CM | POA: Diagnosis present

## 2021-04-12 MED ORDER — KETOROLAC TROMETHAMINE 60 MG/2ML IM SOLN
60.0000 mg | Freq: Once | INTRAMUSCULAR | Status: AC
  Administered 2021-04-12: 60 mg via INTRAMUSCULAR

## 2021-04-12 MED ORDER — KETOROLAC TROMETHAMINE 60 MG/2ML IM SOLN
INTRAMUSCULAR | Status: AC
  Filled 2021-04-12: qty 2

## 2021-04-12 MED ORDER — ORPHENADRINE CITRATE 30 MG/ML IJ SOLN
60.0000 mg | Freq: Once | INTRAMUSCULAR | Status: AC
  Administered 2021-04-12: 60 mg via INTRAMUSCULAR

## 2021-04-12 MED ORDER — ORPHENADRINE CITRATE 30 MG/ML IJ SOLN
INTRAMUSCULAR | Status: AC
  Filled 2021-04-12: qty 2

## 2021-04-12 NOTE — Progress Notes (Signed)
Safety precautions to be maintained throughout the outpatient stay will include: orient to surroundings, keep bed in low position, maintain call bell within reach at all times, provide assistance with transfer out of bed and ambulation.  

## 2021-04-12 NOTE — Patient Instructions (Addendum)
______________________________________________________________________  Preparing for Procedure with Sedation  NOTICE: Due to recent regulatory changes, starting on December 07, 2020, procedures requiring intravenous (IV) sedation will no longer be performed at the [facility].  These types of procedures are required to be performed at [facility] ambulatory surgery facility.  We are very sorry for the inconvenience.  Procedure appointments are limited to planned procedures: No Prescription Refills. No disability issues will be discussed. No medication changes will be discussed.  Instructions: Oral Intake: Do not eat or drink anything for at least 8 hours prior to your procedure. (Exception: Blood Pressure Medication. See below.) Transportation: A driver is required. You may not drive yourself after the procedure. Blood Pressure Medicine: Do not forget to take your blood pressure medicine with a sip of water the morning of the procedure. If your Diastolic (lower reading) is above 100 mmHg, elective cases will be cancelled/rescheduled. Blood thinners: These will need to be stopped for procedures. Notify our staff if you are taking any blood thinners. Depending on which one you take, there will be specific instructions on how and when to stop it. Diabetics on insulin: Notify the staff so that you can be scheduled 1st case in the morning. If your diabetes requires high dose insulin, take only  of your normal insulin dose the morning of the procedure and notify the staff that you have done so. Preventing infections: Shower with an antibacterial soap the morning of your procedure. Build-up your immune system: Take 1000 mg of Vitamin C with every meal (3 times a day) the day prior to your procedure. Antibiotics: Inform the staff if you have a condition or reason that requires you to take antibiotics before dental procedures. Pregnancy: If you are pregnant, call and cancel the procedure. Sickness: If  you have a cold, fever, or any active infections, call and cancel the procedure. Arrival: You must be in the facility at least 30 minutes prior to your scheduled procedure. Children: Do not bring children with you. Dress appropriately: Bring dark clothing that you would not mind if they get stained. Valuables: Do not bring any jewelry or valuables.  Reasons to call and reschedule or cancel your procedure: (Following these recommendations will minimize the risk of a serious complication.) Surgeries: Avoid having procedures within 2 weeks of any surgery. (Avoid for 2 weeks before or after any surgery). Flu Shots: Avoid having procedures within 2 weeks of a flu shots. (Avoid for 2 weeks before or after immunizations). Barium: Avoid having a procedure within 7-10 days after having had a radiological study involving the use of radiological contrast. (Myelograms, Barium swallow or enema study). Heart attacks: Avoid any elective procedures or surgeries for the initial 6 months after a "Myocardial Infarction" (Heart Attack). Blood thinners: It is imperative that you stop these medications before procedures. Let us know if you if you take any blood thinner.  Infection: Avoid procedures during or within two weeks of an infection (including chest colds or gastrointestinal problems). Symptoms associated with infections include: Localized redness, fever, chills, night sweats or profuse sweating, burning sensation when voiding, cough, congestion, stuffiness, runny nose, sore throat, diarrhea, nausea, vomiting, cold or Flu symptoms, recent or current infections. It is specially important if the infection is over the area that we intend to treat. Heart and lung problems: Symptoms that may suggest an active cardiopulmonary problem include: cough, chest pain, breathing difficulties or shortness of breath, dizziness, ankle swelling, uncontrolled high or unusually low blood pressure, and/or palpitations. If you are    experiencing any of these symptoms, cancel your procedure and contact your primary care physician for an evaluation.  Remember:  Regular Business hours are:  Monday to Thursday 8:00 AM to 4:00 PM  Provider's Schedule: Izyk Marty, MD:  Procedure days: Tuesday and Thursday 7:30 AM to 4:00 PM  Bilal Lateef, MD:  Procedure days: Monday and Wednesday 7:30 AM to 4:00 PM ______________________________________________________________________  ____________________________________________________________________________________________  General Risks and Possible Complications  Patient Responsibilities: It is important that you read this as it is part of your informed consent. It is our duty to inform you of the risks and possible complications associated with treatments offered to you. It is your responsibility as a patient to read this and to ask questions about anything that is not clear or that you believe was not covered in this document.  Patient's Rights: You have the right to refuse treatment. You also have the right to change your mind, even after initially having agreed to have the treatment done. However, under this last option, if you wait until the last second to change your mind, you may be charged for the materials used up to that point.  Introduction: Medicine is not an exact science. Everything in Medicine, including the lack of treatment(s), carries the potential for danger, harm, or loss (which is by definition: Risk). In Medicine, a complication is a secondary problem, condition, or disease that can aggravate an already existing one. All treatments carry the risk of possible complications. The fact that a side effects or complications occurs, does not imply that the treatment was conducted incorrectly. It must be clearly understood that these can happen even when everything is done following the highest safety standards.  No treatment: You can choose not to proceed with the  proposed treatment alternative. The "PRO(s)" would include: avoiding the risk of complications associated with the therapy. The "CON(s)" would include: not getting any of the treatment benefits. These benefits fall under one of three categories: diagnostic; therapeutic; and/or palliative. Diagnostic benefits include: getting information which can ultimately lead to improvement of the disease or symptom(s). Therapeutic benefits are those associated with the successful treatment of the disease. Finally, palliative benefits are those related to the decrease of the primary symptoms, without necessarily curing the condition (example: decreasing the pain from a flare-up of a chronic condition, such as incurable terminal cancer).  General Risks and Complications: These are associated to most interventional treatments. They can occur alone, or in combination. They fall under one of the following six (6) categories: no benefit or worsening of symptoms; bleeding; infection; nerve damage; allergic reactions; and/or death. No benefits or worsening of symptoms: In Medicine there are no guarantees, only probabilities. No healthcare provider can ever guarantee that a medical treatment will work, they can only state the probability that it may. Furthermore, there is always the possibility that the condition may worsen, either directly, or indirectly, as a consequence of the treatment. Bleeding: This is more common if the patient is taking a blood thinner, either prescription or over the counter (example: Goody Powders, Fish oil, Aspirin, Garlic, etc.), or if suffering a condition associated with impaired coagulation (example: Hemophilia, cirrhosis of the liver, low platelet counts, etc.). However, even if you do not have one on these, it can still happen. If you have any of these conditions, or take one of these drugs, make sure to notify your treating physician. Infection: This is more common in patients with a compromised  immune system, either due to disease (example:   diabetes, cancer, human immunodeficiency virus [HIV], etc.), or due to medications or treatments (example: therapies used to treat cancer and rheumatological diseases). However, even if you do not have one on these, it can still happen. If you have any of these conditions, or take one of these drugs, make sure to notify your treating physician. Nerve Damage: This is more common when the treatment is an invasive one, but it can also happen with the use of medications, such as those used in the treatment of cancer. The damage can occur to small secondary nerves, or to large primary ones, such as those in the spinal cord and brain. This damage may be temporary or permanent and it may lead to impairments that can range from temporary numbness to permanent paralysis and/or brain death. Allergic Reactions: Any time a substance or material comes in contact with our body, there is the possibility of an allergic reaction. These can range from a mild skin rash (contact dermatitis) to a severe systemic reaction (anaphylactic reaction), which can result in death. Death: In general, any medical intervention can result in death, most of the time due to an unforeseen complication. ____________________________________________________________________________________________  

## 2021-04-27 ENCOUNTER — Ambulatory Visit: Payer: Medicare HMO | Attending: Pain Medicine | Admitting: Pain Medicine

## 2021-04-27 NOTE — Progress Notes (Deleted)
No-show to procedure  

## 2021-05-03 DIAGNOSIS — Z113 Encounter for screening for infections with a predominantly sexual mode of transmission: Secondary | ICD-10-CM | POA: Diagnosis not present

## 2021-05-03 DIAGNOSIS — N390 Urinary tract infection, site not specified: Secondary | ICD-10-CM | POA: Diagnosis not present

## 2021-05-14 ENCOUNTER — Telehealth: Payer: Self-pay | Admitting: Family Medicine

## 2021-05-14 DIAGNOSIS — G9050 Complex regional pain syndrome I, unspecified: Secondary | ICD-10-CM

## 2021-05-14 NOTE — Telephone Encounter (Signed)
I will place a referral to Care Guides for community / financial assistance with these requests.  Helen Pilar, DO [facility] 05/14/2021, 5:21 PM
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
## 2021-05-17 ENCOUNTER — Telehealth: Payer: Self-pay

## 2021-05-17 NOTE — Telephone Encounter (Signed)
Error

## 2021-05-18 ENCOUNTER — Telehealth: Payer: Self-pay | Admitting: *Deleted

## 2021-05-18 NOTE — Telephone Encounter (Signed)
° °  Telephone encounter was:  Successful.  05/18/2021 Name: Helen Sparks MRN: [id number] DOB: Jun 23, 1985  Helen Sparks is a 36 y.o. year old female who is a primary care patient of Olin Hauser, DO . The community resource team was consulted for assistance with Food Insecurity and Financial Difficulties related to utilities  and rent   Care guide performed the following interventions: Behind on rent 498.00 , utilities  close to 400.00 and patient is starting new job hopefully soon. Going to email resources and also will submit a referal to [facility].  Follow Up Plan:  Will call in the next few days   [facility]  [phone] [address] , [place] [address] Email : [email or web address]

## 2021-05-20 ENCOUNTER — Telehealth: Payer: Self-pay | Admitting: *Deleted

## 2021-05-20 NOTE — Telephone Encounter (Signed)
° °  Telephone encounter was:  Unsuccessful.  05/20/2021 Name: Helen Sparks MRN: [id number] DOB: Dec 14, 1985  Unsuccessful outbound call made today to assist with:  Food Insecurity and Financial Difficulties related to rent and utilities  Outreach Attempt:  2nd Attempt  Mailbox is full unable to leave message   Alois Cliche -[facility] Guide , Embedded Care Coordination [facility], Care Management  [phone] [address] [place] , [place] [address] Email : [email or web address]

## 2021-05-24 ENCOUNTER — Telehealth: Payer: Self-pay | Admitting: *Deleted

## 2021-05-24 NOTE — Telephone Encounter (Signed)
° °  Telephone encounter was:  Successful.  05/24/2021 Name: Helen Sparks MRN: [id number] DOB: Mar 07, 1986  Helen Sparks is a 36 y.o. year old female who is a primary care patient of Smitty Cords, DO . The community resource team was consulted for assistance with Patient was approved for rental assitance through [employer] but cannot verify that as it is Babs Bertin day and the agency is closed   Care guide performed the following interventions: Patient provided with information about care guide support team and interviewed to confirm resource needs Follow up call placed to community resources to determine status of patients referral.  Follow Up Plan:  No further follow up planned at this time. The patient has been provided with needed resources.  Alois Cliche -[facility] Guide , Embedded Care Coordination [facility], Care Management  [phone] [address] [place] , [place] [address] Email : [email or web address]

## 2021-05-25 ENCOUNTER — Telehealth: Payer: Self-pay | Admitting: *Deleted

## 2021-05-25 NOTE — Telephone Encounter (Signed)
° °  Telephone encounter was:  Successful.  05/25/2021 Name: Keilly Fatula MRN: [id number] DOB: [birth year]  Meghin Thivierge is a 36 y.o. year old female who is a primary care patient of Smitty Cords, DO . The community resource team was consulted for assistance with Food Insecurity and Financial Difficulties related to housing   Care guide performed the following interventions: Patient provided with information about care guide support team and interviewed to confirm resource needs Follow up call placed to community resources to determine status of patients referral Follow up call placed to the patient to discuss status of referral.Spoke with Ms Roseanne Reno and patient has had her rent paid up to date , is filing for food stamps and I have updated [place] [address] to advise food banks have not reached out yet , also emailed her more resources.   Follow Up Plan:  No further follow up planned at this time. The patient has been provided with needed resources.  Alois Cliche -[facility] Guide , Embedded Care Coordination [facility], Care Management  [phone] [address] [place] , [place] [address] Email : [email or web address]

## 2021-06-21 ENCOUNTER — Ambulatory Visit (INDEPENDENT_AMBULATORY_CARE_PROVIDER_SITE_OTHER): Payer: Medicare HMO

## 2021-06-21 ENCOUNTER — Other Ambulatory Visit: Payer: Self-pay

## 2021-06-21 DIAGNOSIS — Z0000 Encounter for general adult medical examination without abnormal findings: Secondary | ICD-10-CM

## 2021-06-21 NOTE — Progress Notes (Signed)
Influenza Vaccine: Not up-to-date  COVID Vaccine:  Not up-to-date COVID Bivalent: Not Up-to-Date Pap Smear:  01/06/21-next scheduled date 01/07/2024 Mammogram: Never TDAP Vaccine:: 02/24/2019 next scheduled date 02/23/2029  Primary Provider: Saralyn Pilar, DO Neurologist: Nilda Calamity, PA OB GYN: Elonda Husky, MD Pain Management: Delano Metz, MD

## 2021-06-21 NOTE — Progress Notes (Signed)
Virtual Visit via Telephone Note  I connected with Helen Sparks on 06/21/21 at 10:00 AM EST by telephone and verified that I am speaking with the correct person using two identifiers.  Location: Patient: Helen Sparks Provider:  Saralyn Pilar, DO  I discussed the limitations, risks, security and privacy concerns of performing an evaluation and management service by telephone and the availability of in person appointments. I also discussed with the patient that there may be a patient responsible charge related to this service. The patient expressed understanding and agreed to proceed.   I provided 40 minutes of non-face-to-face time during this encounter.   Lonna Cobb, CMA   HPI:  Patient presents to clinic today for their subsequent annual Medicare wellness exam.       Family History  Problem Relation Age of Onset   Hypertension Maternal Grandmother    Diabetes Maternal Grandmother    Leukemia Maternal Grandmother      6CIT Screen 06/21/2021  What Year? 0 points  What month? 0 points  What time? 0 points  Count back from 20 0 points  Months in reverse 0 points  Repeat phrase 0 points  Total Score 0     Hospitiliaztions: No hospitalization over the last year    Health Maintenance: Influenza Vaccine: Not up-to-date, declined   COVID Vaccine:  Pt reports getting COVID vaccines, but doesn't know where she got them.  COVID Bivalent: Not Up-to-Date Pap Smear:  01/06/21-next scheduled date 01/07/2024 Mammogram: Never TDAP Vaccine:: 02/24/2019 next scheduled date 02/23/2029   Providers: Primary Provider: Saralyn Pilar, DO Neurologist: Nilda Calamity, PA OB GYN: Elonda Husky, MD Pain Management: Delano Metz, MD Eye Doctor: 2 yrs ago  Dental Exam: 3 yrs ago     I have personally reviewed and have noted:  1. The patient's medical and social history 2. Their use of alcohol, tobacco or illicit drugs 3. Their current medications and  supplements 4. The patient's functional ability including ADL's, fall risks, home safety risks and hearing or visual impairment. 5. Diet and physical activities 6. Evidence for depression or mood disorder     PE:   There were no vitals taken for this visit. Wt Readings from Last 3 Encounters:  04/12/21 191 lb (86.6 kg)  03/24/21 187 lb 6.3 oz (85 kg)  02/18/21 187 lb (84.8 kg)     BMET    Component Value Date/Time   NA 136 04/08/2021 1259   K 3.8 04/08/2021 1259   CL 105 04/08/2021 1259   CO2 25 04/08/2021 1259   GLUCOSE 79 04/08/2021 1259   BUN 8 04/08/2021 1259   CREATININE 0.68 04/08/2021 1259   CALCIUM 9.0 04/08/2021 1259   GFRNONAA >60 04/08/2021 1259    Lipid Panel  No results found for: CHOL, TRIG, HDL, CHOLHDL, VLDL, LDLCALC  CBC    Component Value Date/Time   WBC 7.9 04/08/2021 1259   RBC 4.63 04/08/2021 1259   HGB 12.4 04/08/2021 1259   HCT 39.9 04/08/2021 1259   PLT 355 04/08/2021 1259   MCV 86.2 04/08/2021 1259   MCH 26.8 04/08/2021 1259   MCHC 31.1 04/08/2021 1259   RDW 14.6 04/08/2021 1259   LYMPHSABS 2.6 03/24/2021 1213   MONOABS 0.7 03/24/2021 1213   EOSABS 0.2 03/24/2021 1213   BASOSABS 0.0 03/24/2021 1213    Hgb A1C No results found for: HGBA1C    Assessment and Plan:   Medicare Annual Wellness Visit:  Diet: Heart healthy  Physical activity: Sedentary Depression/mood  screen:  Flowsheet Row Office Visit from 06/21/2021 in [place]  PHQ-9 Total Score 11      Hearing: Intact to whispered voice Visual acuity: Declined annual eye exam  ADLs: Capable Fall risk: Recent Fall with injury Home safety: Good Cognitive evaluation:  6CIT Score 0 points Goals: Quit smoking, given information on smoking cessation  EOL planning: No advance directive, declined    Nurse's Notes: Pt reports that she fell trying to rush in get in the house while it was raining. She state she slipped and landed on her back and hit her head  on the porch. She complains that her body hurts all over and the back of her head hurts from the fall. She requested that her PCP Dr. Althea Charon send her over something for pain. I recommended that the patient be evaluated for injury and possible concussion through the ER or Urgent Care. The pt requested to scheduled an appt with her PCP, Appt scheduled for tomorrow. I informed her if her symptoms worsen that she need to seek emergent care.   The pt requested assistance with her rent and utilities.  [facility] referral placed.   Next appointment: Appt scheduled for Tuesday, Feb. 14th    Lonna Cobb, CMA

## 2021-06-22 ENCOUNTER — Ambulatory Visit: Payer: Medicare HMO | Admitting: Family Medicine

## 2021-07-01 NOTE — Addendum Note (Signed)
Addended by: Lonna Cobb on: 07/01/2021 04:03 PM   Modules accepted: Level of Service

## 2021-08-16 ENCOUNTER — Ambulatory Visit: Payer: Self-pay | Admitting: *Deleted

## 2021-08-16 ENCOUNTER — Other Ambulatory Visit: Payer: Self-pay | Admitting: Family Medicine

## 2021-08-16 DIAGNOSIS — G9050 Complex regional pain syndrome I, unspecified: Secondary | ICD-10-CM

## 2021-08-16 DIAGNOSIS — G894 Chronic pain syndrome: Secondary | ICD-10-CM

## 2021-08-16 DIAGNOSIS — M797 Fibromyalgia: Secondary | ICD-10-CM

## 2021-08-16 NOTE — Telephone Encounter (Signed)
I returned pt's call.   C/o increased pain in back and knee discomfort.  Requesting something be called in for the discomfort when possible.   Has had precious issues and documentation history of her discomfort. ? ? ?Reason for Disposition ? [1] Pain radiates into the thigh or further down the leg AND [2] one leg ? ?Answer Assessment - Initial Assessment Questions ?1. ONSET: "When did the pain begin?"  ?    My back near my tailbone is hurting.   It hurts to sit down.    A few months back I was coming into the house and it was raining and I slipped on the water.   Not seen a dr. For this. ? ?My menstrual cycle is heavy.   In Feb. It was real heavy.   In March it was heavy and bled for over 2 weeks.   ?I don't have a way to get around.    ?2. LOCATION: "Where does it hurt?" (upper, mid or lower back) ?    All pain in tailbone area and around to the front to the lower pelvis.   It feels like my uterus.   I'm having pain in lower pelvis area.    ?It hurts when I sit, lay down, sleep, walking.   ?3. SEVERITY: "How bad is the pain?"  (e.g., Scale 1-10; mild, moderate, or severe) ?  - MILD (1-3): doesn't interfere with normal activities  ?  - MODERATE (4-7): interferes with normal activities or awakens from sleep  ?  - SEVERE (8-10): excruciating pain, unable to do any normal activities  ?    Moderation ?4. PATTERN: "Is the pain constant?" (e.g., yes, no; constant, intermittent)  ?    I don't have anything for pain.    ?Pain is constant.   I was referred to the pain medicine dr.   I don't want to have the injections he wants me to take in my back. ?5. RADIATION: "Does the pain shoot into your legs or elsewhere?" ?    The left leg has pain going dowon into it. ?6. CAUSE:  "What do you think is causing the back pain?"  ?    Maybe a messed up disc don't really know. ?7. BACK OVERUSE:  "Any recent lifting of heavy objects, strenuous work or exercise?" ?     ?8. MEDICATIONS: "What have you taken so far for the pain?" (e.g.,  nothing, acetaminophen, NSAIDS) ?    Don't have any medication to take. ?9. NEUROLOGIC SYMPTOMS: "Do you have any weakness, numbness, or problems with bowel/bladder control?" ?    *No Answer* ?10. OTHER SYMPTOMS: "Do you have any other symptoms?" (e.g., fever, abdominal pain, burning with urination, blood in urine) ?       ?11. PREGNANCY: "Is there any chance you are pregnant?" (e.g., yes, no; LMP) ?      *No Answer* ? ?Protocols used: Back Pain-A-AH ? ?

## 2021-08-16 NOTE — Telephone Encounter (Signed)
I called patient back and gave her [facility] information [phone] if her insurance can not get her to the appt.  ?

## 2021-08-16 NOTE — Telephone Encounter (Signed)
?  Chief Complaint: pain in tailbone radiating into left leg.   Heavy menstrual cycle bleeding.  Needs transportation and is requesting a Education officer, museum for help with her bills. ?Symptoms: lower back (tailbone area) pain radiating down left leg.  Also pain in lower pelvic area.  Heavy menstrual bleeding ?Frequency: Constantly  ?Pertinent Negatives: Patient denies having difficulty walking. ?Disposition: [] ED /[] Urgent Care (no appt availability in office) / [x] Appointment(In office/virtual)/ []  [facility] [place] [facility]/ [] Home Care/ [] Refused Recommended Disposition /[] [facility]/ []  Follow-up with PCP ?Additional Notes: Pt is going to call her insurance company regarding getting transportation to the dr. Hilaria Ota this Friday.   If that does not work out she will call the office and let them know she will need transportation.   She is going to call today to her insurance co.    ?

## 2021-08-16 NOTE — Telephone Encounter (Signed)
Copied from [place] [id number]. Topic: Quick Communication - Rx Refill/Question ?>> Aug 16, 2021 10:38 AM Tessa Lerner A wrote: ?Medication: pregabalin (LYRICA) 100 MG capsule [id number]  ? ?DULoxetine (CYMBALTA) 60 MG capsule [id number]  ? ?Has the patient contacted their pharmacy? Yes.   ?(Agent: If no, request that the patient contact the pharmacy for the refill. If patient does not wish to contact the pharmacy document the reason why and proceed with request.) ?(Agent: If yes, when and what did the pharmacy advise?) ? ?Preferred Pharmacy (with phone number or street name): [facility] [address] [place] [address] ?[place] [address] ?Phone: [phone] Fax: [phone] ?Hours: Not open 24 hours ? ?Has the patient been seen for an appointment in the last year OR does the patient have an upcoming appointment? Yes.   ? ?Agent: Please be advised that RX refills may take up to 3 business days. We ask that you follow-up with your pharmacy. ?

## 2021-08-17 MED ORDER — PREGABALIN 50 MG PO CAPS: 50.0000 mg | ORAL_CAPSULE | Freq: Three times a day (TID) | ORAL | 0 refills | Status: DC

## 2021-08-17 MED ORDER — DULOXETINE HCL 60 MG PO CPEP: 60.0000 mg | ORAL_CAPSULE | Freq: Every day | ORAL | 0 refills | Status: DC

## 2021-08-17 NOTE — Telephone Encounter (Signed)
Requested medication (s) are due for refill today - no ? ?Requested medication (s) are on the active medication list -no ? ?Future visit scheduled -yes ? ?Last refill: unsure ? ?Notes to clinic: Request RF: medications are no longer current on medication list ? ?Requested Prescriptions  ?Pending Prescriptions Disp Refills  ? DULoxetine (CYMBALTA) 60 MG capsule 30 capsule 2  ?  Sig: Take 1 capsule (60 mg total) by mouth daily.  ?  ? Psychiatry: Antidepressants - SNRI - duloxetine Passed - 08/16/2021 12:04 PM  ?  ?  Passed - Cr in normal range and within 360 days  ?  Creatinine, Ser  ?Date Value Ref Range Status  ?04/08/2021 0.68 0.44 - 1.00 mg/dL Final  ?  ?  ?  ?  Passed - eGFR is 30 or above and within 360 days  ?  GFR, Estimated  ?Date Value Ref Range Status  ?04/08/2021 >60 >60 mL/min Final  ?  Comment:  ?  (NOTE) ?Calculated using the CKD-EPI Creatinine Equation (2021) ?  ?  ?  ?  ?  Passed - Completed PHQ-2 or PHQ-9 in the last 360 days  ?  ?  Passed - Last BP in normal range  ?  BP Readings from Last 1 Encounters:  ?04/12/21 127/83  ?  ?  ?  ?  Passed - Valid encounter within last 6 months  ?  Recent Outpatient Visits   ? ?      ? 8 months ago Complex regional pain syndrome type 1, affecting unspecified site  ? [place], DO  ? 10 months ago Anterior leg pain, left  ? [place], DO  ? 1 year ago Chronic pain syndrome  ? [place], DO  ? ?  ?  ?Future Appointments   ? ?        ? In 3 days Parks Ranger, Devonne Doughty, DO [facility]  ? ?  ? ?  ?  ?  ? pregabalin (LYRICA) 50 MG capsule 90 capsule 0  ?  Sig: Take 1 capsule (50 mg total) by mouth 3 (three) times daily.  ?  ? Not Delegated - Neurology:  Anticonvulsants - Controlled - pregabalin Failed - 08/16/2021 12:04 PM  ?  ?  Failed - This refill cannot be delegated  ?  ?  Passed - Cr in normal range and within 360 days   ?  Creatinine, Ser  ?Date Value Ref Range Status  ?04/08/2021 0.68 0.44 - 1.00 mg/dL Final  ?  ?  ?  ?  Passed - Completed PHQ-2 or PHQ-9 in the last 360 days  ?  ?  Passed - Valid encounter within last 12 months  ?  Recent Outpatient Visits   ? ?      ? 8 months ago Complex regional pain syndrome type 1, affecting unspecified site  ? [place], DO  ? 10 months ago Anterior leg pain, left  ? [place], DO  ? 1 year ago Chronic pain syndrome  ? [place], DO  ? ?  ?  ?Future Appointments   ? ?        ? In 3 days Parks Ranger, Devonne Doughty, DO [facility] [place]  ? ?  ? ?  ?  ?  ? ? ? ?  Requested Prescriptions  ?Pending Prescriptions Disp Refills  ? DULoxetine (CYMBALTA) 60 MG capsule 30 capsule 2  ?  Sig: Take 1 capsule (60 mg total) by mouth daily.  ?  ? Psychiatry: Antidepressants - SNRI - duloxetine Passed - 08/16/2021 12:04 PM  ?  ?  Passed - Cr in normal range and within 360 days  ?  Creatinine, Ser  ?Date Value Ref Range Status  ?04/08/2021 0.68 0.44 - 1.00 mg/dL Final  ?  ?  ?  ?  Passed - eGFR is 30 or above and within 360 days  ?  GFR, Estimated  ?Date Value Ref Range Status  ?04/08/2021 >60 >60 mL/min Final  ?  Comment:  ?  (NOTE) ?Calculated using the CKD-EPI Creatinine Equation (2021) ?  ?  ?  ?  ?  Passed - Completed PHQ-2 or PHQ-9 in the last 360 days  ?  ?  Passed - Last BP in normal range  ?  BP Readings from Last 1 Encounters:  ?04/12/21 127/83  ?  ?  ?  ?  Passed - Valid encounter within last 6 months  ?  Recent Outpatient Visits   ? ?      ? 8 months ago Complex regional pain syndrome type 1, affecting unspecified site  ? [facility] Karamalegos, Alexander J, DO  ? 10 months ago Anterior leg pain, left  ? [facility] Karamalegos, Alexander J, DO  ? 1 year ago Chronic pain syndrome  ? [facility] Karamalegos,  Alexander J, DO  ? ?  ?  ?Future Appointments   ? ?        ? In 3 days Karamalegos, Alexander J, DO [facility]  ? ?  ? ?  ?  ?  ? pregabalin (LYRICA) 50 MG capsule 90 capsule 0  ?  Sig: Take 1 capsule (50 mg total) by mouth 3 (three) times daily.  ?  ? Not Delegated - Neurology:  Anticonvulsants - Controlled - pregabalin Failed - 08/16/2021 12:04 PM  ?  ?  Failed - This refill cannot be delegated  ?  ?  Passed - Cr in normal range and within 360 days  ?  Creatinine, Ser  ?Date Value Ref Range Status  ?04/08/2021 0.68 0.44 - 1.00 mg/dL Final  ?  ?  ?  ?  Passed - Completed PHQ-2 or PHQ-9 in the last 360 days  ?  ?  Passed - Valid encounter within last 12 months  ?  Recent Outpatient Visits   ? ?      ? 8 months ago Complex regional pain syndrome type 1, affecting unspecified site  ? [facility] Karamalegos, Alexander J, DO  ? 10 months ago Anterior leg pain, left  ? [facility] Karamalegos, Alexander J, DO  ? 1 year ago Chronic pain syndrome  ? [facility] Karamalegos, Alexander J, DO  ? ?  ?  ?Future Appointments   ? ?        ? In 3 days Karamalegos, Alexander J, DO [facility]  ? ?  ? ?  ?  ?  ? ? ? ?

## 2021-08-20 ENCOUNTER — Ambulatory Visit: Payer: Medicare HMO | Admitting: Family Medicine

## 2021-09-07 ENCOUNTER — Telehealth: Payer: Self-pay

## 2021-09-07 NOTE — Telephone Encounter (Signed)
Copied from CRM [id number]. Topic: General - Inquiry ?>> Sep 06, 2021  8:12 AM Aretta Nip wrote: ?Reason for CRM: Pt states she has her 4 grandchildren, rent is due and they have nothing, she has gotten help before and can't remember who had helped her/ wants someone at office with financial aid contacts to call [phone] ? ?Morrie Sheldon said maybe you could help.  ?

## 2021-09-08 ENCOUNTER — Telehealth: Payer: Self-pay | Admitting: Licensed Clinical Social Worker

## 2021-09-08 NOTE — Telephone Encounter (Signed)
? ?   Clinical Social Work  ?Care Management  ? Phone Outreach  ? ? ?09/08/2021 ?Name: Helen Sparks MRN: [id number] DOB: 07/21/1985 ? ?Helen Sparks is a 36 y.o. year old female who is a primary care patient of Smitty Cords, DO .  ? ?Reason for referral: [employer] .   ? ?CCM LCSW reached out to patient today by phone to introduce self, assess needs and offer Care Management services and interventions.    Telephone outreach was unsuccessful. Unable to leave a HIPPA compliant phone message due to voice mail not set up. ? ?Plan:CCM LCSW will wait for return call. If no return call is received, Will route chart to Care Guide to see if patient would like to reschedule phone appointment  ? ?Review of patient status, including review of consultants reports, relevant laboratory and other test results, and collaboration with appropriate care team members and the patient's provider was performed as part of comprehensive patient evaluation and provision of care management services.   ? ?Jenel Lucks, MSW, LCSW ?[facility] Care Management ?[place]  [facility] ?[email or web address] ?Phone [phone] ?4:19 PM ? ? ? ?  ? ? ? ? ?

## 2021-09-09 NOTE — Telephone Encounter (Addendum)
Pt is calling Jasmine back Please advise  ? ?CB- [phone] ?[email or web address] ? ?Pt states is is in need of desperate help. Attempted to transfer the pt . Received recording line was not in service  ? ? ? ?

## 2021-09-10 ENCOUNTER — Telehealth: Payer: Self-pay

## 2021-09-10 NOTE — Chronic Care Management (AMB) (Signed)
  Care Coordination Note  09/10/2021 Name: Helen Sparks MRN: [id number] DOB: [birth year]  Helen Sparks is a 36 y.o. year old female who is a primary care patient of Smitty Cords, DO and is actively engaged with the care management team. I reached out to [facility] by phone today to assist with re-scheduling a follow up visit with the Licensed Clinical Social Worker  Follow up plan: Unsuccessful telephone outreach attempt made. A HIPAA compliant phone message was left for the patient providing contact information and requesting a return call.  The care management team will reach out to the patient again over the next 7 days.  If patient returns call to provider office, please advise to call Embedded Care Management Care Guide Helen Sparks  at [phone]  Helen Sparks, RMA Care Guide, Embedded Care Coordination [facility]  [place], [place] [address] Direct Dial: [phone] [email or web address]: [email or web address]

## 2021-09-13 ENCOUNTER — Telehealth: Payer: Self-pay | Admitting: *Deleted

## 2021-09-13 ENCOUNTER — Other Ambulatory Visit: Payer: Self-pay

## 2021-09-13 DIAGNOSIS — M797 Fibromyalgia: Secondary | ICD-10-CM

## 2021-09-13 NOTE — Telephone Encounter (Signed)
? ?  Telephone encounter was:  Successful.  ?09/13/2021 ?Name: Helen Sparks MRN: [id number] DOB: 1986-01-07 ? ?Helen Sparks is a 36 y.o. year old female who is a primary care patient of Smitty Cords, DO . The community resource team was consulted for assistance with Patient provided information on allied [place] and d[place] of [place] will follow up i a few days to see if there is anywhere else for her to go ? ?Care guide performed the following interventions: Patient provided with information about care guide support team and interviewed to confirm resource needs ?Discussed resources to assist with rent . ? ?Follow Up Plan:  Care guide will follow up with patient by phone over the next days ?Aleysha Meckler Greenauer -Berneda Rose ?Care Guide , Embedded Care Coordination ?[place], Care Management  ?[phone] ?[address] [place] , [place] [address] ?Email : [email or web address] ?  ?

## 2021-09-20 ENCOUNTER — Telehealth: Payer: Self-pay | Admitting: *Deleted

## 2021-09-20 NOTE — Telephone Encounter (Signed)
? ?  Telephone encounter was:  Unsuccessful.  09/20/2021 ?Name: Helen Sparks MRN: [id number] DOB: 03/25/1986 ? ?Unsuccessful outbound call made today to assist with:  Transportation Needs , Food Insecurity, and Financial Difficulties related to rent ? ?Outreach Attempt:  2nd Attempt ? ?A HIPAA compliant voice message was left requesting a return call.  Instructed patient to call back at   Instructed patient to call back at [phone]  at their earliest convenience. Marland Kitchen ?Alois Cliche -Berneda Rose ?Care Guide , Embedded Care Coordination ?[place], Care Management  ?[phone] ?[address] [place] , [place] [address] ?Email : [email or web address] ?  ? ?

## 2021-09-22 ENCOUNTER — Telehealth: Payer: Self-pay | Admitting: *Deleted

## 2021-09-22 NOTE — Telephone Encounter (Signed)
? ?  Telephone encounter was:  Successful.  ?09/22/2021 ?Name: Kinzlee Selvy MRN: [id number] DOB: October 19, 1985 ? ?Laasya Peyton is a 36 y.o. year old female who is a primary care patient of Smitty Cords, DO . The community resource team was consulted for assistance with  Rent and eviction ? ?Care guide performed the following interventions: patient has about a  third of her needed rent but the landlord wants to proceed if she does not have the whole thing to evict her  ?Asking LCSW about eligibility for [facility] financial assistance as I have no other community resources for her at this time. ? ?Follow Up Plan:  Care guide will follow up with patient by phone over the next days ? ?Kyrian Stage Greenauer -Berneda Rose ?Care Guide , Embedded Care Coordination ?[place], Care Management  ?[phone] ?[address] [place] , [place] [address] ?Email : [email or web address] ?  ? ?

## 2021-09-23 ENCOUNTER — Telehealth: Payer: Self-pay | Admitting: *Deleted

## 2021-09-23 ENCOUNTER — Ambulatory Visit: Payer: Medicare HMO | Admitting: Licensed Clinical Social Worker

## 2021-09-23 NOTE — Telephone Encounter (Signed)
   Telephone encounter was:  Successful.  09/23/2021 Name: Penelopi Vanhorne MRN: [id number] DOB: [birth year]  Daesha Sutherby is a 36 y.o. year old female who is a primary care patient of Olin Hauser, DO . The community resource team was consulted for assistance with  rent  Care guide performed the following interventions: [9:26 AM] Bonnielee Haff     Patient receives disability  , and had a payment being taken out that made her account freeze and overdraft . She now has the money going to a new account and no longer has auto pay out of the account  . She has rent for June part of new bill that  943.89 and she has 348.89 that is left for the balance in may 2023,Due  By 5/16/ 2023 but have not started court document.  Attempting to find out if she will qualify for the patient assistance fund       .  Follow Up Plan:  Care guide will outreach resources to assist patient with rent  [place], Care Management  [phone] [address] , [place] [address] Email : [email or web address]

## 2021-09-23 NOTE — Telephone Encounter (Signed)
   Telephone encounter was:  Unsuccessful.  09/23/2021 Name: Helen Sparks MRN: [id number] DOB: 07/09/1985  Unsuccessful outbound call made today to assist with:   Rent  Outreach Attempt:  3rd Attempt.  Referral closed unable to contact patient.  A HIPAA compliant Left a message  that social work would be reaching ou to complete her application  Alois Cliche -[facility] Guide , Embedded Care Coordination [facility], Care Management  [phone] [address] [place] , [place] [address] Email : [email or web address]

## 2021-09-28 ENCOUNTER — Telehealth: Payer: Self-pay | Admitting: *Deleted

## 2021-09-28 ENCOUNTER — Telehealth: Payer: Self-pay | Admitting: Licensed Clinical Social Worker

## 2021-09-28 NOTE — Telephone Encounter (Signed)
  Care Management   Follow Up Note   09/28/2021 Name: Helen Sparks MRN: [id number] DOB: 1986-04-02   Referred by: Smitty Cords, DO Reason for referral : No chief complaint on file.   Third unsuccessful telephone outreach was attempted today. The patient was referred to the case management team for assistance with care management and care coordination. The patient's primary care provider has been notified of our unsuccessful attempts to make or maintain contact with the patient. The care management team is pleased to engage with this patient at any time in the future should he/she be interested in assistance from the care management team.   Follow Up Plan: The care management team will reach out to the patient again over the next 30 days.   Ander Gaster , MSW Social Worker [facility] Care Management  [phone]

## 2021-09-29 ENCOUNTER — Telehealth: Payer: Medicare HMO

## 2021-10-01 ENCOUNTER — Ambulatory Visit: Payer: Self-pay | Admitting: Licensed Clinical Social Worker

## 2021-10-01 NOTE — Chronic Care Management (AMB) (Signed)
  Care Management   Social Work Visit Note  10/01/2021 Name: Tosca Brandis MRN: [id number] DOB: [birth year]  Heahter Bilek is a 36 y.o. year old female who sees Olin Hauser, DO for primary care. The care management team was consulted for assistance with care management and care coordination needs related to Financial Difficulties related to rent and utilities.     Patient was given the following information about care management and care coordination services today, agreed to services, and gave verbal consent: 1.care management/care coordination services include personalized support from designated clinical staff supervised by their physician, including individualized plan of care and coordination with other care providers 2. 24/7 contact phone numbers for assistance for urgent and routine care needs. 3. The patient may stop care management/care coordination services at any time by phone call to the office staff.  Engaged with patient by telephone for initial visit in response to provider referral for social work chronic care management and care coordination services.  Assessment: Review of patient history, allergies, and health status during evaluation of patient need for care management/care coordination services.    Interventions:  Patient interviewed and appropriate assessments performed Collaborated with clinical team regarding patient needs  Successful outreach to patient. SW thoroughly completed SDOH and Needs assessment. SW completed Research scientist (medical).   SDOH (Social Determinants of Health) assessments performed: Yes     Plan:  Follow up with patient once decision is made on application.  Lenor Derrick , MSW Social Worker [facility] Care Management  [phone]

## 2021-10-06 DIAGNOSIS — F1290 Cannabis use, unspecified, uncomplicated: Secondary | ICD-10-CM | POA: Diagnosis not present

## 2021-10-06 DIAGNOSIS — G4700 Insomnia, unspecified: Secondary | ICD-10-CM | POA: Diagnosis not present

## 2021-10-06 DIAGNOSIS — F17210 Nicotine dependence, cigarettes, uncomplicated: Secondary | ICD-10-CM | POA: Diagnosis not present

## 2021-10-06 DIAGNOSIS — E559 Vitamin D deficiency, unspecified: Secondary | ICD-10-CM | POA: Diagnosis not present

## 2021-10-06 DIAGNOSIS — Z0001 Encounter for general adult medical examination with abnormal findings: Secondary | ICD-10-CM | POA: Diagnosis not present

## 2021-10-06 DIAGNOSIS — F4310 Post-traumatic stress disorder, unspecified: Secondary | ICD-10-CM | POA: Diagnosis not present

## 2021-10-06 DIAGNOSIS — Z1159 Encounter for screening for other viral diseases: Secondary | ICD-10-CM | POA: Diagnosis not present

## 2021-10-06 DIAGNOSIS — Z79899 Other long term (current) drug therapy: Secondary | ICD-10-CM | POA: Diagnosis not present

## 2021-10-06 DIAGNOSIS — F330 Major depressive disorder, recurrent, mild: Secondary | ICD-10-CM | POA: Diagnosis not present

## 2021-10-26 NOTE — Patient Instructions (Signed)
Visit Information  Instructions: patient will work with SW to address concerns related to financial assistance  Patient was given the following information about care management and care coordination services today, agreed to services, and gave verbal consent: 1.care management/care coordination services include personalized support from designated clinical staff supervised by their physician, including individualized plan of care and coordination with other care providers 2. 24/7 contact phone numbers for assistance for urgent and routine care needs. 3. The patient may stop care management/care coordination services at any time by phone call to the office staff.  Patient verbalizes understanding of instructions and care plan provided today and agrees to view in MyChart. Active MyChart status and patient understanding of how to access instructions and care plan via MyChart confirmed with patient.     The care management team will reach out to the patient again over the next 30 days.   Ander Gaster, MSW  Social Worker [facility] Care Management  [phone]

## 2021-12-02 ENCOUNTER — Encounter: Payer: Self-pay | Admitting: Psychology

## 2022-06-23 ENCOUNTER — Encounter: Payer: Medicare HMO | Attending: Psychology | Admitting: Psychology

## 2022-09-03 ENCOUNTER — Encounter (HOSPITAL_COMMUNITY): Payer: Self-pay

## 2022-09-03 ENCOUNTER — Other Ambulatory Visit: Payer: Self-pay

## 2022-09-03 ENCOUNTER — Emergency Department (HOSPITAL_COMMUNITY): Payer: Medicare HMO

## 2022-09-03 ENCOUNTER — Emergency Department (HOSPITAL_COMMUNITY)
Admission: EM | Admit: 2022-09-03 | Discharge: 2022-09-03 | Disposition: A | Discharge status: Home / Self Care | Payer: Medicare HMO | Attending: Emergency Medicine | Admitting: Emergency Medicine

## 2022-09-03 DIAGNOSIS — R109 Unspecified abdominal pain: Secondary | ICD-10-CM

## 2022-09-03 DIAGNOSIS — R112 Nausea with vomiting, unspecified: Secondary | ICD-10-CM | POA: Diagnosis not present

## 2022-09-03 DIAGNOSIS — J45909 Unspecified asthma, uncomplicated: Secondary | ICD-10-CM | POA: Insufficient documentation

## 2022-09-03 DIAGNOSIS — E876 Hypokalemia: Secondary | ICD-10-CM | POA: Diagnosis not present

## 2022-09-03 DIAGNOSIS — R1031 Right lower quadrant pain: Secondary | ICD-10-CM | POA: Insufficient documentation

## 2022-09-03 DIAGNOSIS — D649 Anemia, unspecified: Secondary | ICD-10-CM | POA: Diagnosis not present

## 2022-09-03 DIAGNOSIS — I10 Essential (primary) hypertension: Secondary | ICD-10-CM | POA: Insufficient documentation

## 2022-09-03 DIAGNOSIS — Z79899 Other long term (current) drug therapy: Secondary | ICD-10-CM | POA: Insufficient documentation

## 2022-09-03 DIAGNOSIS — R197 Diarrhea, unspecified: Secondary | ICD-10-CM | POA: Insufficient documentation

## 2022-09-03 LAB — URINALYSIS, W/ REFLEX TO CULTURE (INFECTION SUSPECTED)
Bacteria, UA: NONE SEEN
Bilirubin Urine: NEGATIVE
Glucose, UA: NEGATIVE mg/dL
Ketones, ur: 5 mg/dL — AB
Leukocytes,Ua: NEGATIVE
Nitrite: NEGATIVE
Protein, ur: NEGATIVE mg/dL
Specific Gravity, Urine: 1.01 (ref 1.005–1.030)
pH: 6 (ref 5.0–8.0)

## 2022-09-03 LAB — COMPREHENSIVE METABOLIC PANEL
ALT: 13 U/L (ref 0–44)
AST: 23 U/L (ref 15–41)
Albumin: 3.4 g/dL — ABNORMAL LOW (ref 3.5–5.0)
Alkaline Phosphatase: 60 U/L (ref 38–126)
Anion gap: 10 (ref 5–15)
BUN: 7 mg/dL (ref 6–20)
CO2: 22 mmol/L (ref 22–32)
Calcium: 8.5 mg/dL — ABNORMAL LOW (ref 8.9–10.3)
Chloride: 106 mmol/L (ref 98–111)
Creatinine, Ser: 0.64 mg/dL (ref 0.44–1.00)
GFR, Estimated: >60 mL/min (ref >60)
Glucose, Bld: 96 mg/dL (ref 70–99)
Potassium: 3 mmol/L — ABNORMAL LOW (ref 3.5–5.1)
Sodium: 138 mmol/L (ref 135–145)
Total Bilirubin: 0.3 mg/dL (ref 0.3–1.2)
Total Protein: 6.2 g/dL — ABNORMAL LOW (ref 6.5–8.1)

## 2022-09-03 LAB — CBC WITH DIFFERENTIAL/PLATELET
Abs Immature Granulocytes: 0.01 10*3/uL (ref 0.00–0.07)
Basophils Absolute: 0 10*3/uL (ref 0.0–0.1)
Basophils Relative: 0 %
Eosinophils Absolute: 0.2 10*3/uL (ref 0.0–0.5)
Eosinophils Relative: 3 %
HCT: 34.7 % — ABNORMAL LOW (ref 36.0–46.0)
Hemoglobin: 11.3 g/dL — ABNORMAL LOW (ref 12.0–15.0)
Immature Granulocytes: 0 %
Lymphocytes Relative: 31 %
Lymphs Abs: 2.6 10*3/uL (ref 0.7–4.0)
MCH: 28.9 pg (ref 26.0–34.0)
MCHC: 32.6 g/dL (ref 30.0–36.0)
MCV: 88.7 fL (ref 80.0–100.0)
Monocytes Absolute: 0.6 10*3/uL (ref 0.1–1.0)
Monocytes Relative: 8 %
Neutro Abs: 5 10*3/uL (ref 1.7–7.7)
Neutrophils Relative %: 58 %
Platelets: 246 10*3/uL (ref 150–400)
RBC: 3.91 MIL/uL (ref 3.87–5.11)
RDW: 14.2 % (ref 11.5–15.5)
WBC: 8.4 10*3/uL (ref 4.0–10.5)
nRBC: 0 % (ref 0.0–0.2)

## 2022-09-03 LAB — HCG, SERUM, QUALITATIVE: Preg, Serum: NEGATIVE

## 2022-09-03 LAB — LIPASE, BLOOD: Lipase: 33 U/L (ref 11–51)

## 2022-09-03 MED ORDER — SODIUM CHLORIDE 0.9 % IV BOLUS
1000.0000 mL | Freq: Once | INTRAVENOUS | Status: AC
  Administered 2022-09-03: 1000 mL via INTRAVENOUS

## 2022-09-03 MED ORDER — SODIUM CHLORIDE 0.9 % IV SOLN: INTRAVENOUS | Status: DC

## 2022-09-03 MED ORDER — ONDANSETRON HCL 4 MG/2ML IJ SOLN: 4.0000 mg | Freq: Once | INTRAMUSCULAR | Status: DC

## 2022-09-03 MED ORDER — FENTANYL CITRATE PF 50 MCG/ML IJ SOSY
50.0000 ug | PREFILLED_SYRINGE | Freq: Once | INTRAMUSCULAR | Status: AC
  Administered 2022-09-03: 50 ug via INTRAVENOUS
  Filled 2022-09-03: qty 1

## 2022-09-03 MED ORDER — PROCHLORPERAZINE EDISYLATE 10 MG/2ML IJ SOLN
10.0000 mg | Freq: Once | INTRAMUSCULAR | Status: AC
  Administered 2022-09-03: 10 mg via INTRAVENOUS
  Filled 2022-09-03: qty 2

## 2022-09-03 MED ORDER — POTASSIUM CHLORIDE 10 MEQ/100ML IV SOLN
10.0000 meq | INTRAVENOUS | Status: AC
  Administered 2022-09-03 (×2): 10 meq via INTRAVENOUS
  Filled 2022-09-03 (×2): qty 100

## 2022-09-03 MED ORDER — POTASSIUM CHLORIDE CRYS ER 20 MEQ PO TBCR
40.0000 meq | EXTENDED_RELEASE_TABLET | Freq: Once | ORAL | Status: AC
  Administered 2022-09-03: 40 meq via ORAL
  Filled 2022-09-03: qty 2

## 2022-09-03 MED ORDER — ONDANSETRON 4 MG PO TBDP: 4.0000 mg | ORAL_TABLET | Freq: Three times a day (TID) | ORAL | 0 refills | Status: AC | PRN

## 2022-09-03 NOTE — ED Triage Notes (Signed)
Pt bib ems for n/v/d, flank pain and headache for 3 hrs. Pt states she has had diarrhea for 2 weeks. Pt very anxious with EMS, states she is out of her meds for 2 weeks, cymbalta, lyrica, mathocarbamol, and tramadol
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
## 2023-03-08 ENCOUNTER — Other Ambulatory Visit: Payer: Self-pay

## 2023-03-08 ENCOUNTER — Emergency Department (HOSPITAL_COMMUNITY)
Admission: EM | Admit: 2023-03-08 | Discharge: 2023-03-08 | Disposition: A | Discharge status: Home / Self Care | Payer: Medicare HMO | Attending: Emergency Medicine | Admitting: Emergency Medicine

## 2023-03-08 ENCOUNTER — Emergency Department (HOSPITAL_COMMUNITY): Payer: Medicare HMO

## 2023-03-08 ENCOUNTER — Encounter (HOSPITAL_COMMUNITY): Payer: Self-pay

## 2023-03-08 DIAGNOSIS — R079 Chest pain, unspecified: Secondary | ICD-10-CM | POA: Diagnosis present

## 2023-03-08 DIAGNOSIS — M797 Fibromyalgia: Secondary | ICD-10-CM | POA: Diagnosis not present

## 2023-03-08 DIAGNOSIS — F419 Anxiety disorder, unspecified: Secondary | ICD-10-CM | POA: Insufficient documentation

## 2023-03-08 DIAGNOSIS — G894 Chronic pain syndrome: Secondary | ICD-10-CM | POA: Diagnosis not present

## 2023-03-08 DIAGNOSIS — R9431 Abnormal electrocardiogram [ECG] [EKG]: Secondary | ICD-10-CM | POA: Insufficient documentation

## 2023-03-08 DIAGNOSIS — R52 Pain, unspecified: Secondary | ICD-10-CM

## 2023-03-08 LAB — COMPREHENSIVE METABOLIC PANEL
ALT: 38 U/L (ref 0–44)
AST: 39 U/L (ref 15–41)
Albumin: 3.9 g/dL (ref 3.5–5.0)
Alkaline Phosphatase: 82 U/L (ref 38–126)
Anion gap: 8 (ref 5–15)
BUN: 8 mg/dL (ref 6–20)
CO2: 22 mmol/L (ref 22–32)
Calcium: 9.1 mg/dL (ref 8.9–10.3)
Chloride: 108 mmol/L (ref 98–111)
Creatinine, Ser: 0.6 mg/dL (ref 0.44–1.00)
GFR, Estimated: >60 mL/min (ref >60)
Glucose, Bld: 88 mg/dL (ref 70–99)
Potassium: 3.5 mmol/L (ref 3.5–5.1)
Sodium: 138 mmol/L (ref 135–145)
Total Bilirubin: 0.8 mg/dL (ref 0.3–1.2)
Total Protein: 7.1 g/dL (ref 6.5–8.1)

## 2023-03-08 LAB — CBC
HCT: 32.7 % — ABNORMAL LOW (ref 36.0–46.0)
Hemoglobin: 10.4 g/dL — ABNORMAL LOW (ref 12.0–15.0)
MCH: 26.8 pg (ref 26.0–34.0)
MCHC: 31.8 g/dL (ref 30.0–36.0)
MCV: 84.3 fL (ref 80.0–100.0)
Platelets: 253 10*3/uL (ref 150–400)
RBC: 3.88 MIL/uL (ref 3.87–5.11)
RDW: 14.9 % (ref 11.5–15.5)
WBC: 8.4 10*3/uL (ref 4.0–10.5)
nRBC: 0 % (ref 0.0–0.2)

## 2023-03-08 LAB — BRAIN NATRIURETIC PEPTIDE: B Natriuretic Peptide: 180.8 pg/mL — ABNORMAL HIGH (ref 0.0–100.0)

## 2023-03-08 LAB — TROPONIN I (HIGH SENSITIVITY): Troponin I (High Sensitivity): 5 ng/L (ref <18)

## 2023-03-08 LAB — HCG, SERUM, QUALITATIVE: Preg, Serum: NEGATIVE

## 2023-03-08 MED ORDER — ASPIRIN 81 MG PO CHEW
324.0000 mg | CHEWABLE_TABLET | Freq: Once | ORAL | Status: AC
  Administered 2023-03-08: 324 mg via ORAL
  Filled 2023-03-08: qty 4

## 2023-03-08 MED ORDER — MORPHINE SULFATE (PF) 4 MG/ML IV SOLN
4.0000 mg | Freq: Once | INTRAVENOUS | Status: AC
  Administered 2023-03-08: 4 mg via INTRAVENOUS
  Filled 2023-03-08: qty 1

## 2023-03-08 NOTE — ED Provider Notes (Signed)
[place] EMERGENCY DEPARTMENT AT [facility] Provider Note   CSN: [id number] Arrival date & time: 03/08/23  1053     History  Chief Complaint  Patient presents with   Pain    Chronic regional pain syndrome    Helen Sparks is a 37 y.o. female.  HPI Patient presents with chest pain as well as diffuse pain.  She has a history of chronic pain, notes fibromyalgia and complex regional pain syndrome.  She recently restarted Cymbalta, otherwise takes no pain medication regularly.  Today, after particularly stressful event at [facility] she noticed chest pain.  She notes previously being told that she had" fluid" around her heart, but does not know of a formal diagnosis of heart failure.    Home Medications Prior to Admission medications   Medication Sig Start Date End Date Taking? Authorizing Provider  DULoxetine (CYMBALTA) 60 MG capsule Take 1 capsule (60 mg total) by mouth daily. 08/17/21   Karamalegos, Netta Neat, DO  ibuprofen (ADVIL) 800 MG tablet TAKE 1 TABLET(800 MG) BY MOUTH EVERY 8 HOURS AS NEEDED Patient not taking: Reported on 06/21/2021 02/23/21   Smitty Cords, DO  Multiple Vitamin (MULTIVITAMIN WITH MINERALS) TABS tablet Take 1 tablet by mouth daily. Patient not taking: Reported on 06/21/2021    [provider]  pregabalin (LYRICA) 50 MG capsule Take 1 capsule (50 mg total) by mouth 3 (three) times daily. 08/17/21   Karamalegos, Netta Neat, DO  Probiotic Product (PROBIOTIC PO) Take 1 capsule by mouth daily. Patient not taking: Reported on 06/21/2021    [provider]      Allergies    Bactrim [sulfamethoxazole-trimethoprim] and Penicillins    Review of Systems   Review of Systems  Physical Exam Updated Vital Signs BP (!) 151/106   Pulse 72   Temp 99.3 F (37.4 C) (Oral)   Resp 18   Ht 5\' 2"  (1.575 m)   Wt 70.8 kg   LMP 02/14/2023 (Exact Date)   SpO2 100%   BMI 28.53 kg/m  Physical Exam Vitals and nursing note reviewed.   Constitutional:      General: She is not in acute distress.    Appearance: She is well-developed.  HENT:     Head: Normocephalic and atraumatic.  Eyes:     Conjunctiva/sclera: Conjunctivae normal.  Cardiovascular:     Rate and Rhythm: Normal rate and regular rhythm.  Pulmonary:     Effort: Pulmonary effort is normal. No respiratory distress.     Breath sounds: Normal breath sounds. No stridor.  Abdominal:     General: There is no distension.  Skin:    General: Skin is warm and dry.  Neurological:     Mental Status: She is alert and oriented to person, place, and time.     Cranial Nerves: No cranial nerve deficit.  Psychiatric:        Mood and Affect: Mood is anxious.     ED Results / Procedures / Treatments   Labs (all labs ordered are listed, but only abnormal results are displayed) Labs Reviewed  CBC - Abnormal; Notable for the following components:      Result Value   Hemoglobin 10.4 (*)    HCT 32.7 (*)    All other components within normal limits  BRAIN NATRIURETIC PEPTIDE - Abnormal; Notable for the following components:   B Natriuretic Peptide 180.8 (*)    All other components within normal limits  HCG, SERUM, QUALITATIVE  COMPREHENSIVE METABOLIC PANEL  CBG MONITORING, ED  TROPONIN I (HIGH SENSITIVITY)  TROPONIN I (HIGH SENSITIVITY)    EKG None  Radiology DG Chest 1 View  Result Date: 03/08/2023 CLINICAL DATA:  Chest pain. EXAM: CHEST  1 VIEW COMPARISON:  01/03/2023. FINDINGS: There are 2, linear opacities overlying the right mid lower lung zones, which may represent scarring and/or atelectasis. Bilateral lung fields are otherwise clear. No acute consolidation or lung collapse. Bilateral lateral costophrenic angles are clear. Normal cardio-mediastinal silhouette. No acute osseous abnormalities. The soft tissues are within normal limits. IMPRESSION: *Focal scarring and/or atelectasis in the right lung. Otherwise, no acute cardiopulmonary abnormality.  Electronically Signed   By: Jules Schick M.D.   On: 03/08/2023 14:55    Procedures Procedures    Medications Ordered in ED Medications  aspirin chewable tablet 324 mg (324 mg Oral Given 03/08/23 1236)  morphine (PF) 4 MG/ML injection 4 mg (4 mg Intravenous Given 03/08/23 1214)    ED Course/ Medical Decision Making/ A&P                                 Medical Decision Making Adult female with chronic pain syndrome, presents with chest pain as well as diffuse pain.  Patient's vital signs are reassuring, but given her history, risk profile, differential including acute on chronic pain versus ACS considered. Patient received analgesia, labs, x-ray.   Amount and/or Complexity of Data Reviewed External Data Reviewed: notes. Labs: ordered. Decision-making details documented in ED Course. Radiology: ordered and independent interpretation performed. Decision-making details documented in ED Course. ECG/medicine tests: ordered and independent interpretation performed. Decision-making details documented in ED Course.  Risk OTC drugs. Prescription drug management. Decision regarding hospitalization. Diagnosis or treatment significantly limited by social determinants of health.   3:04 PM Patient awake, alert, sitting upright, speaking on telephone with no distress.  Findings generally reassuring.  She does have slight elevation in BNP, but no evidence for pulmonary congestion, heart failure exacerbation, no substantial cardiomegaly on x-ray.  Given her improved condition, suspicion for stress exacerbation of her chronic condition.  Patient just reestablished care with a primary care physician, will follow-up with that clinician.        Final Clinical Impression(s) / ED Diagnoses Final diagnoses:  Pain    Rx / DC Orders ED Discharge Orders     None         Gerhard Munch, MD 03/08/23 1504

## 2023-03-08 NOTE — Discharge Instructions (Signed)
As discussed, your evaluation today has been largely reassuring.  But, it is important that you monitor your condition carefully, and do not hesitate to return to the ED if you develop new, or concerning changes in your condition. ? ?Otherwise, please follow-up with your physician for appropriate ongoing care. ? ?

## 2023-03-08 NOTE — ED Triage Notes (Signed)
Pt c/o exacerbation of crps, c/o pain everywhere particularly in chest, states "I have been under so much stress lately, and they have recently told me I have fluid around my heart so I may have congestive heart failure"

## 2023-05-27 IMAGING — CT CT RENAL STONE PROTOCOL
2 of 5 series · 15 of 46 positions shown, 17 images · non-contrast
Comparison: CT abdomen/pelvis 08/31/2020

CLINICAL DATA: Right and left flank pain, history of kidney stone

EXAM:
CT ABDOMEN AND PELVIS WITHOUT CONTRAST
TECHNIQUE: Multidetector CT imaging of the abdomen and pelvis was performed
following the standard protocol without IV contrast.

[Series 5: coronal · coronal · 0.67mm/px · 3 of 151 slices shown]
[im 51/151  soft-tissue]
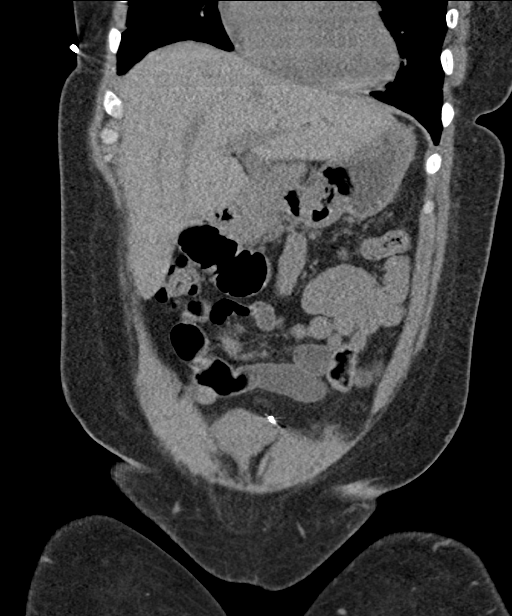
[im 67/151  soft-tissue]
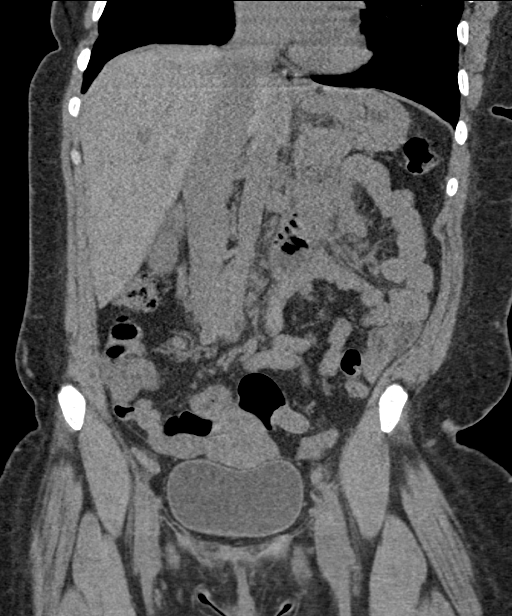
[im 84/151  soft-tissue]
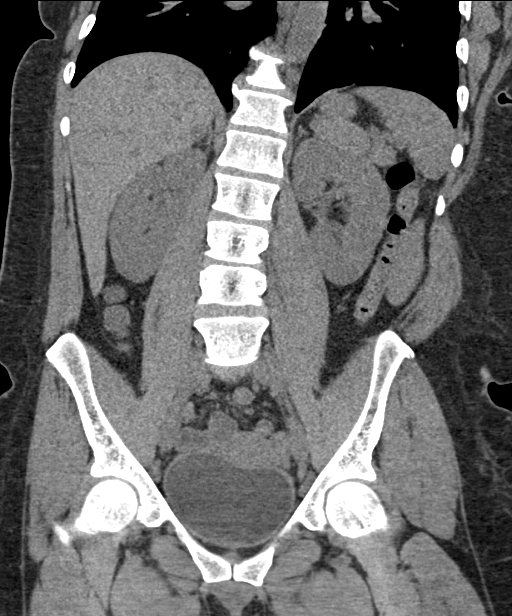

[Series 7: axial · axial · 0.59mm/px · z∈[+986,+1352]mm · 12 of 207 slices shown, 14 images]
[im 12/207  soft-tissue]
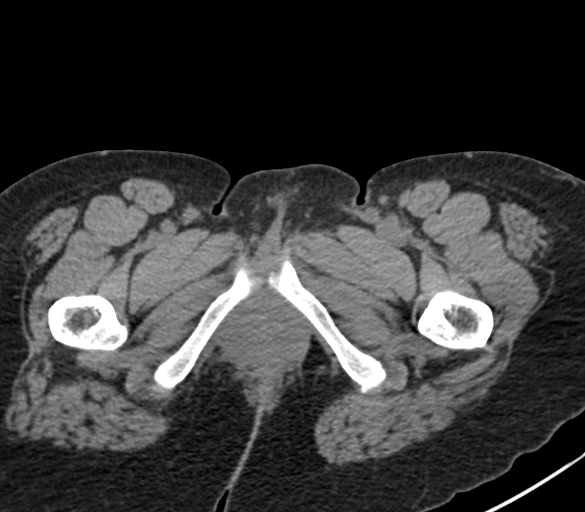
[im 12/207  bone]
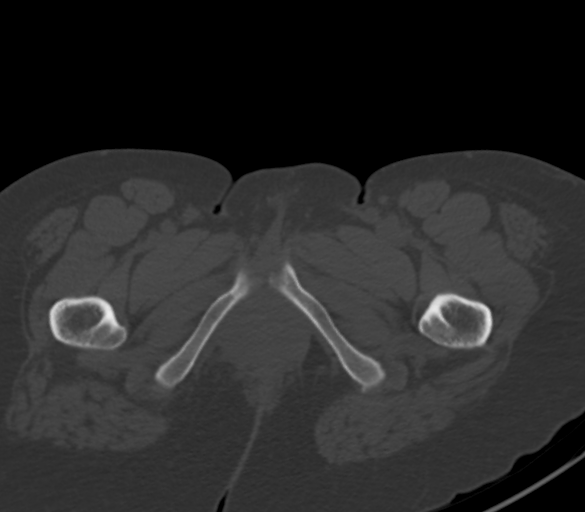
[im 35/207  soft-tissue]
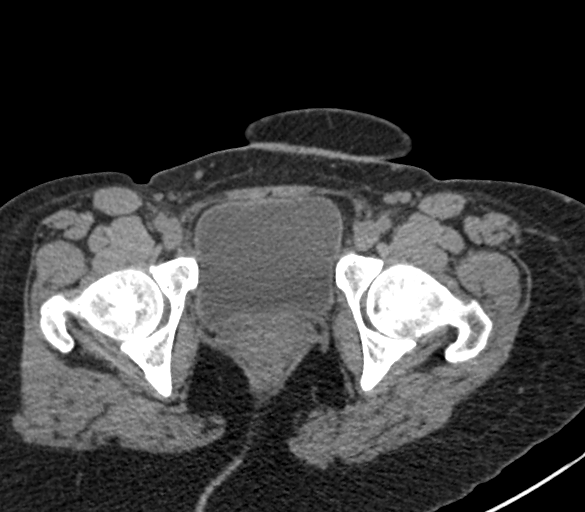
[im 46/207  soft-tissue]
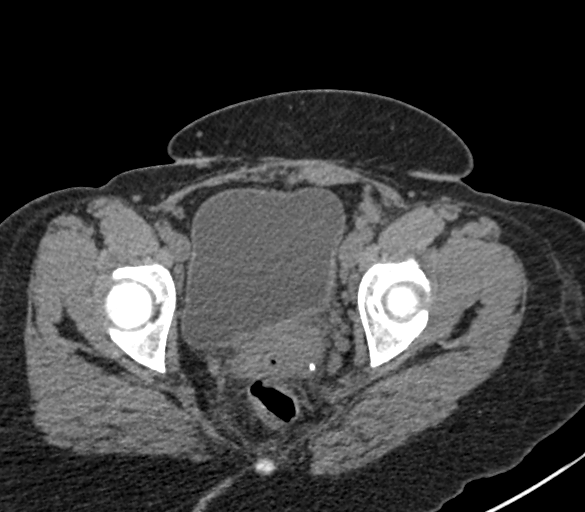
[im 58/207  soft-tissue]
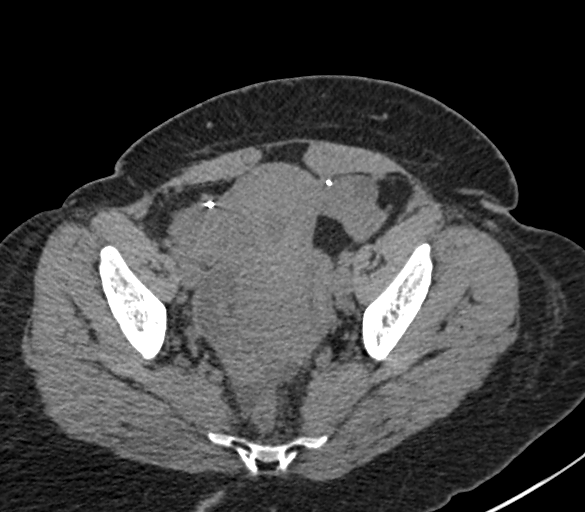
[im 81/207  soft-tissue]
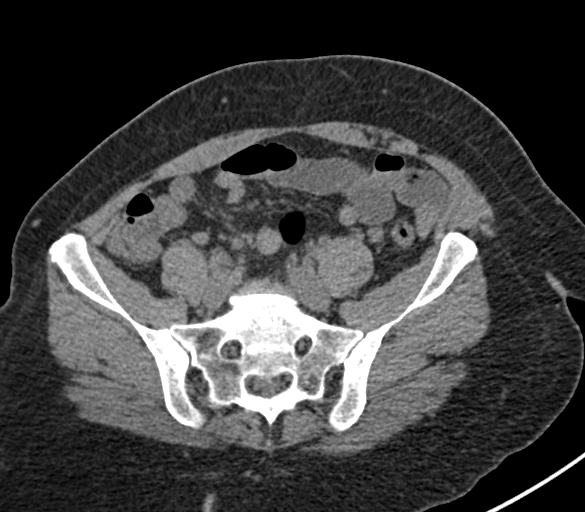
[im 92/207  soft-tissue]
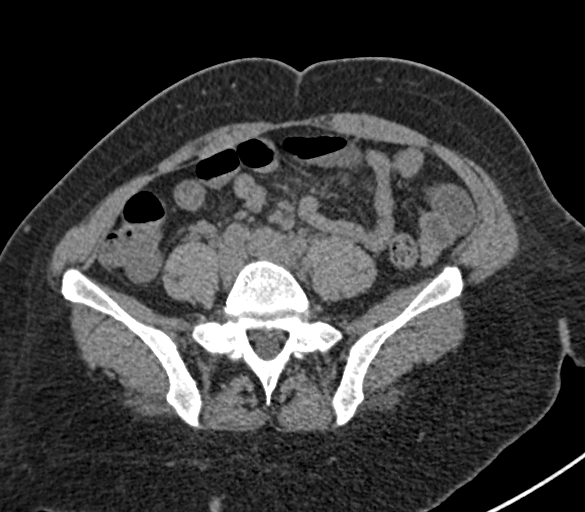
[im 115/207  soft-tissue]
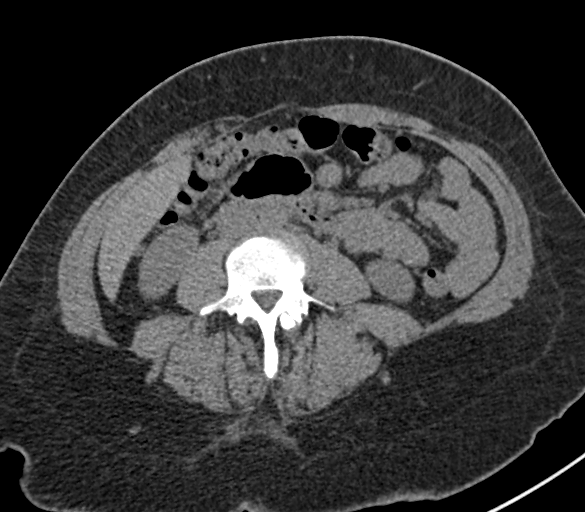
[im 126/207  soft-tissue]
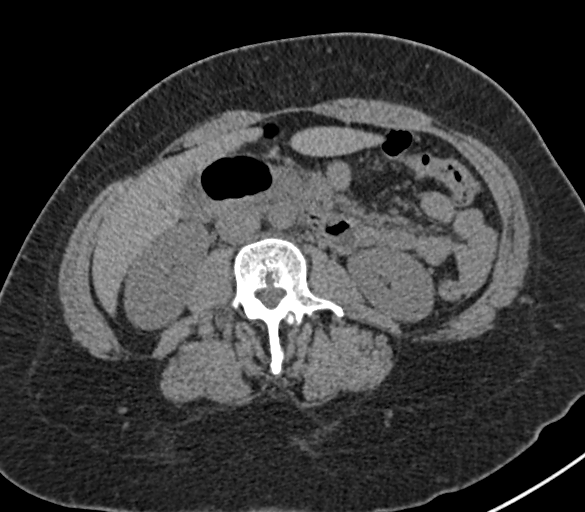
[im 149/207  soft-tissue]
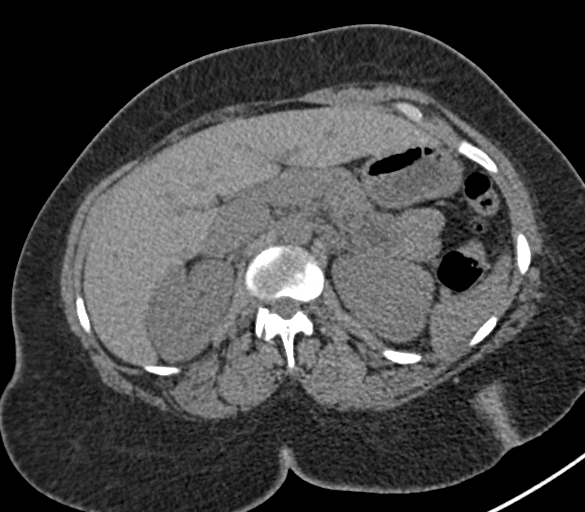
[im 149/207  bone]
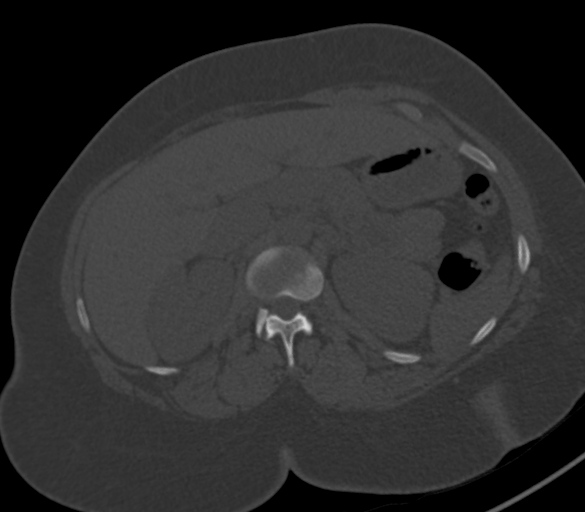
[im 161/207  soft-tissue]
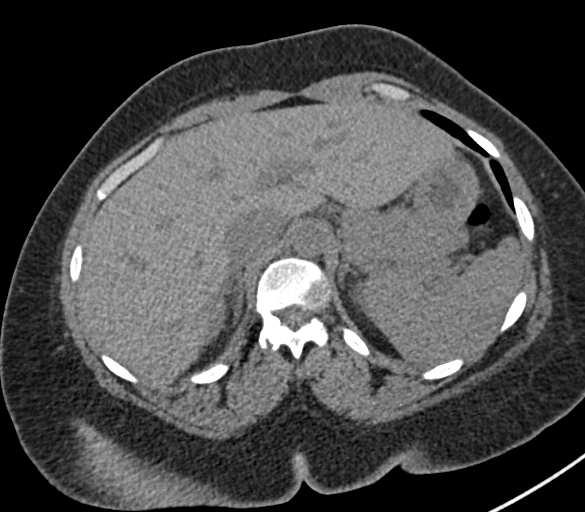
[im 172/207  soft-tissue]
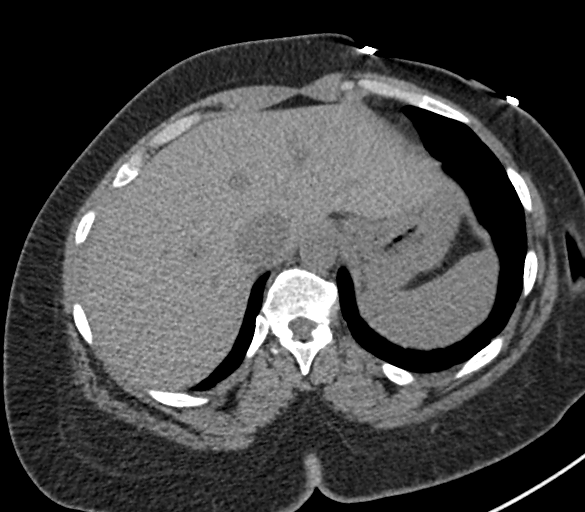
[im 195/207  soft-tissue]
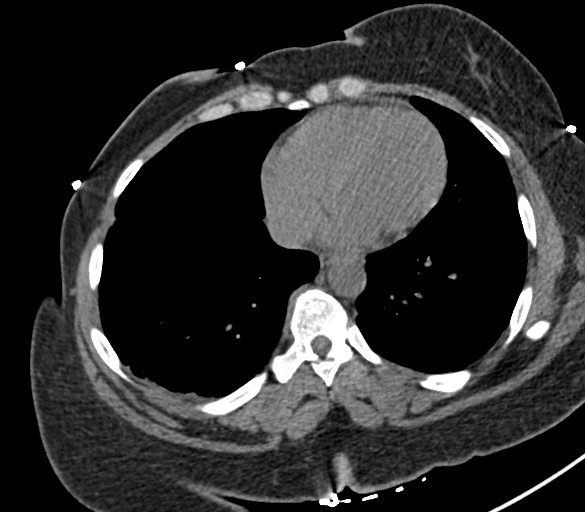

[15 of 46 positions shown; findings below may reference images not displayed]

FINDINGS: Lower chest: There is mild subsegmental atelectasis or scar in the
right lung base. The imaged heart is unremarkable.

Hepatobiliary: The liver and gallbladder are unremarkable. There is
no biliary ductal dilatation.

Pancreas: Unremarkable.

Spleen: Unremarkable.

Adrenals/Urinary Tract: The adrenals are unremarkable.

There is a 2-3 mm nonobstructing right upper pole renal stone. No
other renal or ureteral stones are identified. There is no
hydronephrosis or hydroureter. A small hypodense lesion in the right
upper pole is again seen, likely reflecting a cyst. The bladder is
unremarkable.

Stomach/Bowel: The stomach is unremarkable. There is no evidence of
bowel obstruction. There is no abnormal bowel wall thickening or
inflammatory change. The appendix is normal.

Vascular/Lymphatic: The abdominal aorta is normal in course and
caliber. There is no abdominal or pelvic lymphadenopathy.

Reproductive: The uterus and ovaries are unremarkable. Bilateral
tubal ligation is noted.

Other: There is trace free fluid in the pelvis, within physiologic
limits. There is no free air.

Musculoskeletal: There is no acute osseous abnormality or aggressive
osseous lesion.
IMPRESSION: 1. 2-3 mm nonobstructing right renal stone. No obstructing stones,
hydronephrosis, or hydroureter identified.
2. No other acute pathology in the abdomen or pelvis.

## 2023-06-24 NOTE — ED Triage Notes (Addendum)
 Pt ambulatory to triage endorsing abscess under L armpit -drainage, +pain. Pt also endorsing pelvic pain for 3 days. Hx CRPS and fibromyalgia. Pt also requesting pregnancy test. LMP 06/15/2023. Endorses recent unprotected sex. Gcs 15

## 2023-12-27 ENCOUNTER — Emergency Department
Admission: EM | Admit: 2023-12-27 | Discharge: 2023-12-27 | Disposition: A | Discharge status: Home / Self Care | Attending: Emergency Medicine | Admitting: Emergency Medicine

## 2023-12-27 DIAGNOSIS — G8929 Other chronic pain: Secondary | ICD-10-CM | POA: Diagnosis not present

## 2023-12-27 DIAGNOSIS — M797 Fibromyalgia: Secondary | ICD-10-CM | POA: Diagnosis not present

## 2023-12-27 DIAGNOSIS — R252 Cramp and spasm: Secondary | ICD-10-CM | POA: Diagnosis not present

## 2023-12-27 DIAGNOSIS — G894 Chronic pain syndrome: Secondary | ICD-10-CM

## 2023-12-27 DIAGNOSIS — F419 Anxiety disorder, unspecified: Secondary | ICD-10-CM | POA: Diagnosis present

## 2023-12-27 DIAGNOSIS — G9050 Complex regional pain syndrome I, unspecified: Secondary | ICD-10-CM

## 2023-12-27 LAB — CBC WITH DIFFERENTIAL/PLATELET
Abs Immature Granulocytes: 0.02 K/uL (ref 0.00–0.07)
Basophils Absolute: 0 K/uL (ref 0.0–0.1)
Basophils Relative: 0 %
Eosinophils Absolute: 0.1 K/uL (ref 0.0–0.5)
Eosinophils Relative: 2 %
HCT: 34.6 % — ABNORMAL LOW (ref 36.0–46.0)
Hemoglobin: 11.2 g/dL — ABNORMAL LOW (ref 12.0–15.0)
Immature Granulocytes: 0 %
Lymphocytes Relative: 11 %
Lymphs Abs: 0.8 K/uL (ref 0.7–4.0)
MCH: 26.7 pg (ref 26.0–34.0)
MCHC: 32.4 g/dL (ref 30.0–36.0)
MCV: 82.4 fL (ref 80.0–100.0)
Monocytes Absolute: 0.8 K/uL (ref 0.1–1.0)
Monocytes Relative: 11 %
Neutro Abs: 5.3 K/uL (ref 1.7–7.7)
Neutrophils Relative %: 76 %
Platelets: 239 K/uL (ref 150–400)
RBC: 4.2 MIL/uL (ref 3.87–5.11)
RDW: 15.5 % (ref 11.5–15.5)
WBC: 7 K/uL (ref 4.0–10.5)
nRBC: 0 % (ref 0.0–0.2)

## 2023-12-27 LAB — ETHANOL: Alcohol, Ethyl (B): <15 mg/dL (ref <15)

## 2023-12-27 LAB — SALICYLATE LEVEL: Salicylate Lvl: <7 mg/dL — ABNORMAL LOW (ref 7.0–30.0)

## 2023-12-27 LAB — ACETAMINOPHEN LEVEL: Acetaminophen (Tylenol), Serum: <10 ug/mL — ABNORMAL LOW (ref 10–30)

## 2023-12-27 MED ORDER — IBUPROFEN 600 MG PO TABS
600.0000 mg | ORAL_TABLET | Freq: Once | ORAL | Status: AC
  Administered 2023-12-27: 600 mg via ORAL
  Filled 2023-12-27: qty 1

## 2023-12-27 MED ORDER — PREGABALIN 50 MG PO CAPS: 50.0000 mg | ORAL_CAPSULE | Freq: Three times a day (TID) | ORAL | 0 refills | Status: AC

## 2023-12-27 MED ORDER — DULOXETINE HCL 60 MG PO CPEP: 60.0000 mg | ORAL_CAPSULE | Freq: Every day | ORAL | 0 refills | Status: AC

## 2023-12-27 NOTE — ED Triage Notes (Addendum)
 Pt BIB [facility] from home. Pt reports anxiety, panic attacks, and fibromyalgia pain. Pt states she has been under a lot of stress with family, mother recently passed away. Pt states when she is more stressed her fibromyalgia flares up. Pt reports hand are locking up, legs are very stiff, and pain in her head, chest, and legs. Pt denies SI/HI.  Past Medical History:  Diagnosis Date   Allergy    Anxiety    Asthma    Complex regional pain syndrome I    Depression    Fibromyalgia    Frequent headaches    Hypertension    pregnancy   Kidney stone    Sciatic leg pain    Stroke (HCC)    white spots on frontal lobe possible cva per pt

## 2023-12-27 NOTE — ED Notes (Signed)
 Pt walking out of room yelling and stating I'm leaving. I want my discharge paperwork. I haven't seen a doctor and ain't nothing been done about my chronic pain that I came in for. Told pt that she would have to wait for the doctor to come in and make discharge paperwork. Pt refuses and states she is leaving and continues to walk away from room. This RN escorted pt to lobby while pt continued to yell and was not consolable. Once in lobby pt again states that she wants her discharge paperwork. Reiterated previous information. Pt displeased. Charge RN made aware and charge RN in contact with doctor. Pt ambulatory and visualized walking outside.

## 2023-12-27 NOTE — ED Notes (Signed)
 Pt moved to room 24.  Report off to dorian rn

## 2023-12-27 NOTE — ED Notes (Signed)
 Pt demanding IV catheter removal and discharge paperwork. This RN removed IV catheter and instructed pt to dress out of burgundy scrubs and back into own clothes. All belongings returned to pt and pt dressed out of burgundy scrubs.

## 2023-12-27 NOTE — ED Notes (Addendum)
 Pt belonging 1 pair of black jeans I pair of black socks I black top 1 black purse 1 pair of black shoes 1 black bra 1 copper bangle

## 2023-12-27 NOTE — ED Notes (Addendum)
 Pt sitting outside; spoke with pt at length about her ED visit concerns; st that she came in for pain control due to her hx of fibromyalgia and complex regional pain syndrome; pt denies seeking care for any type of psychiatric issues and is upset because she was treated as such; pt also st that she was never seen by any provider; Pt taken to family room via w/c by charge nurse GORMAN Molt RN; speaking with Dr Jossie at this time regarding ED visit and d/c; after speaking at length with MD; pt assisted with uber transport home

## 2023-12-27 NOTE — ED Provider Notes (Signed)
 [facility] Provider Note   Event Date/Time   First MD Initiated Contact with Patient 12/27/23 1501     (approximate) History  Anxiety and Back Pain (Pt reports generalized pain, back , chest, head, and hands)  HPI Helen Sparks is a 38 y.o. female with a stated past medical history of complex regional pain syndrome, fibromyalgia, and anxiety with panic attacks who presents complaining of increased anxiety after moving back from [place] in November.  Patient is tearful throughout this interview.  Patient states that she has no medications for her fibromyalgia and has no therapist since she has moved back here.  Patient feels overwhelmed.  Patient denies any SI, HI, AVH ROS: Patient currently denies any vision changes, tinnitus, difficulty speaking, facial droop, sore throat, chest pain, shortness of breath, abdominal pain, nausea/vomiting/diarrhea, dysuria, or weakness/numbness/paresthesias in any extremity   Physical Exam  Triage Vital Signs: ED Triage Vitals  Encounter Vitals Group     BP 12/27/23 1448 (!) 153/76     Girls Systolic BP Percentile --      Girls Diastolic BP Percentile --      Boys Systolic BP Percentile --      Boys Diastolic BP Percentile --      Pulse Rate 12/27/23 1448 73     Resp 12/27/23 1448 16     Temp 12/27/23 1448 98 F (36.7 C)     Temp Source 12/27/23 1448 Oral     SpO2 12/27/23 1448 99 %     Weight 12/27/23 1452 165 lb 5.5 oz (75 kg)     Height 12/27/23 1452 5' 2 (1.575 m)     Head Circumference --      Peak Flow --      Pain Score 12/27/23 1452 8     Pain Loc --      Pain Education --      Exclude from Growth Chart --    Most recent vital signs: Vitals:   12/27/23 1448 12/27/23 1451  BP: (!) 153/76   Pulse: 73   Resp: 16   Temp: 98 F (36.7 C)   SpO2: 99% 98%   General: Awake, oriented x4. CV:  Good peripheral perfusion. Resp:  Normal effort. Abd:  No distention. Other:  Middle-aged old obese African American  female resting comfortably in no acute distress ED Results / Procedures / Treatments  Labs (all labs ordered are listed, but only abnormal results are displayed) Labs Reviewed  ACETAMINOPHEN  LEVEL  ETHANOL  COMPREHENSIVE METABOLIC PANEL WITH GFR  SALICYLATE LEVEL  CBC WITH DIFFERENTIAL/PLATELET  URINE DRUG SCREEN, QUALITATIVE ([facility] ONLY)  POC URINE PREG, ED   PROCEDURES: Critical Care performed: No Procedures MEDICATIONS ORDERED IN ED: Medications - No data to display IMPRESSION / MDM / ASSESSMENT AND PLAN / ED COURSE  I reviewed the triage vital signs and the nursing notes.                             The patient is on the cardiac monitor to evaluate for evidence of arrhythmia and/or significant heart rate changes. Patient's presentation is most consistent with acute presentation with potential threat to life or bodily function. Patient is a 16-year-old female with the above-stated past medical history presents for increased anxiety and uncontrolled CRS/fibromyalgia pain.  Differential diagnosis includes but is not limited to: Panic attack, anxiety, bipolar disorder, fibromyalgia flare Plan: Screening labs for possible admission including CBC, CMP,  ethanol, acetaminophen , salicylate, and urine drug screen  Patient will require evaluation by psychiatry given emotional lability during exam to provide resources [facility] consult for further resources in the outpatient setting  Patient decided not to stay in the emergency department to talk to our social work services for outpatient resources.  Patient left before receiving discharge information/paperwork however realized after she tried to go out to the waiting room that she did not have a ride home.  Patient then attempted to come back to her room and was told that she needed to wait in the family waiting room as there was already another patient in her room.  Patient told nursing staff that she wanted to speak with a doctor as she  stated that she had never seen a physician throughout her emergency department stay.  Again prior to leaving and upon visiting patient once again, she had no new medical complaints however was upset and concerns that she did not receive any pain medications for her chronic pain.  Patient was reminded that I saw her upon her arrival and she stated,that not what I said! I meant that I did not see a doctor again.  I informed patient that I refilled her prescription for Cymbalta  and Lyrica  for the next few days 1 of which is a controlled medication.  Patient then raised her voice stating I do know what medications I am on.  Patient then began speaking about her medical history and was difficult to redirect.  Patient was then asked what else she needed during this visit and stated that she needed a ride home.  Charge nurse Garnette provided patient with a ride.  Clinical Course as of 12/27/23 2042  Wed Dec 27, 2023  2041 Patient was seen by psychiatry and cleared for discharge.  Will provide patient with short-term prescription for Cymbalta  and Lyrica  before she can follow-up with a primary care physician.  Will also provide ambulatory referral for primary care  Dispo: Discharge home with PCP follow-up [EB]    Clinical Course User Index [EB] [name], MD   FINAL CLINICAL IMPRESSION(S) / ED DIAGNOSES   Final diagnoses:  Anxiety  Hand cramps   Rx / DC Orders   ED Discharge Orders     None      Note:  This document was prepared using Dragon voice recognition software and may include unintentional dictation errors.   [name], MD 12/27/23 2042    [name], MD 12/28/23 [phone]

## 2024-05-07 ENCOUNTER — Emergency Department

## 2024-05-07 ENCOUNTER — Emergency Department
Admission: EM | Admit: 2024-05-07 | Discharge: 2024-05-07 | Disposition: A | Discharge status: Home / Self Care | Attending: Emergency Medicine | Admitting: Emergency Medicine

## 2024-05-07 ENCOUNTER — Other Ambulatory Visit: Payer: Self-pay

## 2024-05-07 DIAGNOSIS — R519 Headache, unspecified: Secondary | ICD-10-CM | POA: Insufficient documentation

## 2024-05-07 LAB — DIFFERENTIAL
Abs Immature Granulocytes: 0.01 K/uL (ref 0.00–0.07)
Basophils Absolute: 0.1 K/uL (ref 0.0–0.1)
Basophils Relative: 1 %
Eosinophils Absolute: 0.1 K/uL (ref 0.0–0.5)
Eosinophils Relative: 2 %
Immature Granulocytes: 0 %
Lymphocytes Relative: 43 %
Lymphs Abs: 2.8 K/uL (ref 0.7–4.0)
Monocytes Absolute: 0.5 K/uL (ref 0.1–1.0)
Monocytes Relative: 8 %
Neutro Abs: 3 K/uL (ref 1.7–7.7)
Neutrophils Relative %: 46 %

## 2024-05-07 LAB — APTT: aPTT: 31 s (ref 24–36)

## 2024-05-07 LAB — PROTIME-INR
INR: 1 (ref 0.8–1.2)
Prothrombin Time: 13.9 s (ref 11.4–15.2)

## 2024-05-07 LAB — COMPREHENSIVE METABOLIC PANEL WITH GFR
ALT: 22 U/L (ref 0–44)
AST: 41 U/L (ref 15–41)
Albumin: 4.5 g/dL (ref 3.5–5.0)
Alkaline Phosphatase: 79 U/L (ref 38–126)
Anion gap: 13 (ref 5–15)
BUN: 5 mg/dL — ABNORMAL LOW (ref 6–20)
CO2: 26 mmol/L (ref 22–32)
Calcium: 9.5 mg/dL (ref 8.9–10.3)
Chloride: 103 mmol/L (ref 98–111)
Creatinine, Ser: 0.77 mg/dL (ref 0.44–1.00)
GFR, Estimated: >60 mL/min
Glucose, Bld: 70 mg/dL (ref 70–99)
Potassium: 3.7 mmol/L (ref 3.5–5.1)
Sodium: 142 mmol/L (ref 135–145)
Total Bilirubin: 0.3 mg/dL (ref 0.0–1.2)
Total Protein: 7.3 g/dL (ref 6.5–8.1)

## 2024-05-07 LAB — CBC
HCT: 35.9 % — ABNORMAL LOW (ref 36.0–46.0)
Hemoglobin: 11.5 g/dL — ABNORMAL LOW (ref 12.0–15.0)
MCH: 27.4 pg (ref 26.0–34.0)
MCHC: 32 g/dL (ref 30.0–36.0)
MCV: 85.5 fL (ref 80.0–100.0)
Platelets: 294 K/uL (ref 150–400)
RBC: 4.2 MIL/uL (ref 3.87–5.11)
RDW: 14.7 % (ref 11.5–15.5)
WBC: 6.5 K/uL (ref 4.0–10.5)
nRBC: 0 % (ref 0.0–0.2)

## 2024-05-07 LAB — ETHANOL: Alcohol, Ethyl (B): <15 mg/dL

## 2024-05-07 MED ORDER — SODIUM CHLORIDE 0.9% FLUSH
3.0000 mL | Freq: Once | INTRAVENOUS | Status: AC
  Administered 2024-05-07: 3 mL via INTRAVENOUS

## 2024-05-07 MED ORDER — LACTATED RINGERS IV BOLUS
1000.0000 mL | Freq: Once | INTRAVENOUS | Status: AC
  Administered 2024-05-07: 1000 mL via INTRAVENOUS

## 2024-05-07 MED ORDER — DROPERIDOL 2.5 MG/ML IJ SOLN
2.5000 mg | Freq: Once | INTRAMUSCULAR | Status: AC
  Administered 2024-05-07: 2.5 mg via INTRAVENOUS
  Filled 2024-05-07: qty 2

## 2024-05-07 MED ORDER — ACETAMINOPHEN 500 MG PO TABS
1000.0000 mg | ORAL_TABLET | Freq: Once | ORAL | Status: AC
  Administered 2024-05-07: 1000 mg via ORAL
  Filled 2024-05-07: qty 2

## 2024-05-07 MED ORDER — DROPERIDOL 2.5 MG/ML IJ SOLN: 1.2500 mg | Freq: Once | INTRAMUSCULAR | Status: DC

## 2024-05-07 MED ORDER — KETOROLAC TROMETHAMINE 30 MG/ML IJ SOLN
15.0000 mg | Freq: Once | INTRAMUSCULAR | Status: AC
  Administered 2024-05-07: 15 mg via INTRAVENOUS
  Filled 2024-05-07: qty 1

## 2024-05-07 NOTE — ED Triage Notes (Signed)
 BIB [facility] from home.  144/76, P; 91, sats 100% RA.  AAOx3. Skin warm and dry.  Patient ambulatory for EMS. NAD.  Patient's PCP recently told patient that she is having mini strokes.  STroke screen negative.

## 2024-05-07 NOTE — ED Provider Notes (Signed)
 "  [facility] Provider Note    Event Date/Time   First MD Initiated Contact with Patient 05/07/24 1709     (approximate)   History   Headache   HPI  Helen Sparks is a 38 y.o. female who presents to the ED for evaluation of Headache   Patient with self-reported history of migraines, complex regional pain syndrome, fibromyalgia presents to the ED with acute on chronic migrainous headaches.  She reports years of headaches.  More severe intensity behind her left temple over the past couple days, similar quality as normal.   Physical Exam   Triage Vital Signs: ED Triage Vitals  Encounter Vitals Group     BP 05/07/24 1517 139/88     Girls Systolic BP Percentile --      Girls Diastolic BP Percentile --      Boys Systolic BP Percentile --      Boys Diastolic BP Percentile --      Pulse Rate 05/07/24 1517 82     Resp 05/07/24 1517 16     Temp 05/07/24 1517 98.1 F (36.7 C)     Temp Source 05/07/24 1517 Oral     SpO2 05/07/24 1517 96 %     Weight 05/07/24 1518 165 lb 5.5 oz (75 kg)     Height --      Head Circumference --      Peak Flow --      Pain Score 05/07/24 1518 9     Pain Loc --      Pain Education --      Exclude from Growth Chart --     Most recent vital signs: Vitals:   05/07/24 1517 05/07/24 1913  BP: 139/88 (!) 111/46  Pulse: 82 73  Resp: 16 13  Temp: 98.1 F (36.7 C) 98.1 F (36.7 C)  SpO2: 96% 100%    General: Awake, no distress.  CV:  Good peripheral perfusion.  Resp:  Normal effort.  Abd:  No distention.  MSK:  No deformity noted.  Neuro:  No focal deficits appreciated. Cranial nerves II through XII intact 5/5 strength and sensation in all 4 extremities Other:     ED Results / Procedures / Treatments   Labs (all labs ordered are listed, but only abnormal results are displayed) Labs Reviewed  CBC - Abnormal; Notable for the following components:      Result Value   Hemoglobin 11.5 (*)    HCT 35.9 (*)    All  other components within normal limits  COMPREHENSIVE METABOLIC PANEL WITH GFR - Abnormal; Notable for the following components:   BUN 5 (*)    All other components within normal limits  PROTIME-INR  APTT  DIFFERENTIAL  ETHANOL  I-STAT CREATININE, ED  CBG MONITORING, ED  POC URINE PREG, ED    EKG   RADIOLOGY CT head interpreted by me without evidence of acute intracranial pathology  Official radiology report(s): CT HEAD WO CONTRAST Result Date: 05/07/2024 EXAM: CT HEAD WITHOUT CONTRAST 05/07/2024 04:55:41 PM TECHNIQUE: CT of the head was performed without the administration of intravenous contrast. Automated exposure control, iterative reconstruction, and/or weight based adjustment of the mA/kV was utilized to reduce the radiation dose to as low as reasonably achievable. COMPARISON: None available. CLINICAL HISTORY: Neuro deficit, acute, stroke suspected. FINDINGS: BRAIN AND VENTRICLES: No acute hemorrhage. No evidence of acute infarct. No hydrocephalus. No extra-axial collection. No mass effect or midline shift. Moderate periventricular white matter disease. ORBITS: No acute  abnormality. SINUSES: No acute abnormality. SOFT TISSUES AND SKULL: No acute soft tissue abnormality. No skull fracture. IMPRESSION: 1. No acute intracranial abnormality. 2. Moderate periventricular white matter disease. Electronically signed by: Dorethia Molt MD 05/07/2024 05:58 PM EST RP Workstation: [id number]    PROCEDURES and INTERVENTIONS:  Procedures  Medications  sodium chloride  flush (NS) 0.9 % injection 3 mL (3 mLs Intravenous Given 05/07/24 1903)  ketorolac  (TORADOL ) 30 MG/ML injection 15 mg (15 mg Intravenous Given 05/07/24 1857)  acetaminophen  (TYLENOL ) tablet 1,000 mg (1,000 mg Oral Given 05/07/24 1856)  lactated ringers bolus 1,000 mL (1,000 mLs Intravenous New Bag/Given 05/07/24 1900)  droperidol  (INAPSINE ) 2.5 MG/ML injection 2.5 mg (2.5 mg Intravenous Given 05/07/24 1857)     IMPRESSION /  MDM / ASSESSMENT AND PLAN / ED COURSE  I reviewed the triage vital signs and the nursing notes.  Differential diagnosis includes, but is not limited to, migraine, stroke, ICH  {Patient presents with symptoms of an acute illness or injury that is potentially life-threatening.  Patient presents with acute on chronic migraines without evidence of further complicating features.  No neurologic deficits on my exam, freely moving bilateral upper extremities though her triage note with complaints of left hand weakness is noted.  Normal metabolic panel, CBC and CT head.  Will provide migraine cocktail and reassess.  Suspect she will be suitable for outpatient management  Clinical Course as of 05/07/24 1954  Tue May 07, 2024  1954 Reassessed, resolution of symptoms, appreciative, discussed return precautions [DS]    Clinical Course User Index [DS] Claudene Rover, MD     FINAL CLINICAL IMPRESSION(S) / ED DIAGNOSES   Final diagnoses:  Bad headache     Rx / DC Orders   ED Discharge Orders     None        Note:  This document was prepared using Dragon voice recognition software and may include unintentional dictation errors.   Claudene Rover, MD 05/07/24 [phone]  "

## 2024-05-07 NOTE — Discharge Instructions (Signed)
Please take Tylenol and ibuprofen/Advil for your pain.  It is safe to take them together, or to alternate them every few hours.  Take up to 1000mg of Tylenol at a time, up to 4 times per day.  Do not take more than 4000 mg of Tylenol in 24 hours.  For ibuprofen, take 400-600 mg, 3 - 4 times per day.  

## 2024-05-07 NOTE — ED Notes (Signed)
 This tech wheeled pt to triage restroom.

## 2024-05-07 NOTE — ED Triage Notes (Signed)
 Multiple complaints. C/O migraine headache. C/O left side locking up when cold.  C/O left had weakness ongoing x 2 weeks.
